# Patient Record
Sex: Male | Born: 1960 | Race: Black or African American | Hispanic: No | Marital: Married | Smoking: Never smoker
Health system: Southern US, Community
[De-identification: ages and names within clinical notes are randomized; demographics above are authoritative.]

## PROBLEM LIST (undated history)

## (undated) HISTORY — PX: CHOLECYSTECTOMY: SHX55

---

## 2006-03-08 ENCOUNTER — Emergency Department (HOSPITAL_COMMUNITY): Admission: EM | Admit: 2006-03-08 | Discharge: 2006-03-08 | Payer: Self-pay | Admitting: Emergency Medicine

## 2006-04-21 ENCOUNTER — Emergency Department (HOSPITAL_COMMUNITY): Admission: EM | Admit: 2006-04-21 | Discharge: 2006-04-21 | Payer: Self-pay | Admitting: Emergency Medicine

## 2008-10-13 ENCOUNTER — Emergency Department (HOSPITAL_COMMUNITY): Admission: EM | Admit: 2008-10-13 | Discharge: 2008-10-13 | Payer: Self-pay | Admitting: Pediatrics

## 2012-03-06 ENCOUNTER — Emergency Department (HOSPITAL_COMMUNITY)
Admission: EM | Admit: 2012-03-06 | Discharge: 2012-03-07 | Disposition: A | Discharge status: Home / Self Care | Payer: Worker's Compensation | Attending: Emergency Medicine | Admitting: Emergency Medicine

## 2012-03-06 ENCOUNTER — Encounter (HOSPITAL_COMMUNITY): Payer: Self-pay | Admitting: *Deleted

## 2012-03-06 DIAGNOSIS — Y9289 Other specified places as the place of occurrence of the external cause: Secondary | ICD-10-CM | POA: Insufficient documentation

## 2012-03-06 DIAGNOSIS — X12XXXA Contact with other hot fluids, initial encounter: Secondary | ICD-10-CM | POA: Insufficient documentation

## 2012-03-06 DIAGNOSIS — T24219A Burn of second degree of unspecified thigh, initial encounter: Secondary | ICD-10-CM | POA: Insufficient documentation

## 2012-03-06 DIAGNOSIS — T3111 Burns involving 10-19% of body surface with 10-19% third degree burns: Secondary | ICD-10-CM | POA: Insufficient documentation

## 2012-03-06 DIAGNOSIS — X131XXA Other contact with steam and other hot vapors, initial encounter: Secondary | ICD-10-CM | POA: Insufficient documentation

## 2012-03-06 DIAGNOSIS — T3110 Burns involving 10-19% of body surface with 0% to 9% third degree burns: Secondary | ICD-10-CM | POA: Insufficient documentation

## 2012-03-06 MED ORDER — SILVER SULFADIAZINE 1 % EX CREA
TOPICAL_CREAM | CUTANEOUS | Status: AC
  Filled 2012-03-06: qty 50

## 2012-03-06 MED ORDER — IBUPROFEN 800 MG PO TABS: 800.0000 mg | ORAL_TABLET | Freq: Three times a day (TID) | ORAL | Status: AC

## 2012-03-06 MED ORDER — SILVER SULFADIAZINE 1 % EX CREA: TOPICAL_CREAM | Freq: Every day | CUTANEOUS | Status: AC

## 2012-03-06 MED ORDER — KETOROLAC TROMETHAMINE 60 MG/2ML IM SOLN
60.0000 mg | Freq: Once | INTRAMUSCULAR | Status: AC
  Administered 2012-03-06: 60 mg via INTRAMUSCULAR
  Filled 2012-03-06: qty 2

## 2012-03-06 MED ORDER — OXYCODONE-ACETAMINOPHEN 5-325 MG PO TABS
1.0000 | ORAL_TABLET | Freq: Once | ORAL | Status: AC
  Administered 2012-03-06: 1 via ORAL
  Filled 2012-03-06: qty 1

## 2012-03-06 MED ORDER — OXYCODONE-ACETAMINOPHEN 5-325 MG PO TABS
1.0000 | ORAL_TABLET | Freq: Once | ORAL | Status: AC
  Filled 2012-03-06: qty 1

## 2012-03-06 MED ORDER — OXYCODONE-ACETAMINOPHEN 5-325 MG PO TABS: 1.0000 | ORAL_TABLET | Freq: Four times a day (QID) | ORAL | Status: AC | PRN
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
## 2012-03-06 NOTE — ED Notes (Signed)
Cool sterile towels applied to bilateral upper thighs.
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
## 2012-03-07 MED ORDER — SILVER SULFADIAZINE 1 % EX CREA
TOPICAL_CREAM | Freq: Once | CUTANEOUS | Status: AC
  Administered 2012-03-07: via TOPICAL

## 2012-03-17 ENCOUNTER — Ambulatory Visit (HOSPITAL_COMMUNITY)
Admission: RE | Admit: 2012-03-17 | Discharge: 2012-03-17 | Disposition: A | Discharge status: Home / Self Care | Payer: Worker's Compensation | Source: Ambulatory Visit | Attending: Preventative Medicine | Admitting: Preventative Medicine

## 2012-03-17 DIAGNOSIS — T24239A Burn of second degree of unspecified lower leg, initial encounter: Secondary | ICD-10-CM | POA: Insufficient documentation

## 2012-03-17 DIAGNOSIS — IMO0001 Reserved for inherently not codable concepts without codable children: Secondary | ICD-10-CM | POA: Insufficient documentation
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
## 2012-03-18 ENCOUNTER — Ambulatory Visit (HOSPITAL_COMMUNITY)
Admission: RE | Admit: 2012-03-18 | Discharge: 2012-03-18 | Disposition: A | Discharge status: Home / Self Care | Payer: Worker's Compensation | Source: Ambulatory Visit | Attending: Preventative Medicine | Admitting: Preventative Medicine

## 2012-03-18 NOTE — Progress Notes (Signed)
Physical Therapy - Wound Therapy  Treatment   Patient Details  Name: Daniel West MRN: [id number] Date of Birth: Aug 13, 1960  Today's Date: 03/18/2012 Time: 0930-1005 Time Calculation (min): 35 min Charges: Selective debridement (= or < 20 cm)   Visit#: 2  of 5   Re-eval: 03/20/12  Subjective Subjective Assessment Subjective: Pt states that he changed his dressings this morning.  Pain Assessment Pain Assessment Pain Score:   5 Pain Location: Leg Pain Orientation: Right;Left;Anterior;Proximal  Wound Therapy Pt received debridement on L thigh with saline soaked 4x4; R thgih with saline soaked 4x4 and forceps;  followed by dressing with xeroform, 4x4, kling, coban and netting.   Physical Therapy Assessment and Plan Wound Therapy - Assess/Plan/Recommendations Wound Therapy - Clinical Statement: Pt tolerates debridement well. L thigh was cleansed and R thigh was cleansed and debrided with forceps. Pt reports no change in pain at end of session. Wound Plan: Continue wound care per PT POC.       Problem List There is no problem list on file for this patient.    Seth Bake, PTA 03/18/2012, 10:12 AM

## 2012-03-19 ENCOUNTER — Ambulatory Visit (HOSPITAL_COMMUNITY)
Admission: RE | Admit: 2012-03-19 | Discharge: 2012-03-19 | Disposition: A | Discharge status: Home / Self Care | Payer: Worker's Compensation | Source: Ambulatory Visit | Attending: Preventative Medicine | Admitting: Preventative Medicine

## 2012-03-19 NOTE — Progress Notes (Signed)
Physical Therapy - Wound Therapy  Treatment   Patient Details  Name: Daniel West MRN: [id number] Date of Birth: 1961-06-04  Today's Date: 03/19/2012 Time: 4098-1191 Time Calculation (min): 30 min Charges:  Deb <20cm Visit#: 3  of 5   Re-eval: 03/20/12  Subjective Subjective Assessment Subjective: Pt. states his bandages keep sliding down.  Overall feeling better with less drainage.  Pain Assessment Pain Assessment Pain Assessment: No/denies pain  Wound Therapy R LE:  R thigh without drainage or opened wounds.  Cleansed area and instructed to apply lotion and keep protected from sun.  No further skilled care required for R LE. L LE:  Pt received debridement on L thigh with scapel and saline soaked 4x4's;  Remaining areas on approx. 50% granulated with 50% slough remaining.  Pt. Forgot silvadene cream; dressed with xeroform, 4x4, kling, coban and netting.     Physical Therapy Assessment and Plan Wound Therapy - Assess/Plan/Recommendations Wound Therapy - Clinical Statement: R LE wounds healed without drainage or pain.  Only treated L LE today; Pt. instructed to keep R LE moisturized and protected from sun. Wound Plan: Continue present woundcare treatment for L LE; R LE may be taken care of by patient.      Lurena Nida, PTA/CLT 03/19/2012, 11:52 AM

## 2012-03-20 ENCOUNTER — Ambulatory Visit (HOSPITAL_COMMUNITY)
Admission: RE | Admit: 2012-03-20 | Discharge: 2012-03-20 | Disposition: A | Discharge status: Home / Self Care | Payer: Worker's Compensation | Source: Ambulatory Visit | Attending: Preventative Medicine | Admitting: Preventative Medicine
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
## 2012-03-23 ENCOUNTER — Ambulatory Visit (HOSPITAL_COMMUNITY)
Admission: RE | Admit: 2012-03-23 | Discharge: 2012-03-23 | Disposition: A | Discharge status: Home / Self Care | Payer: Worker's Compensation | Source: Ambulatory Visit | Attending: Preventative Medicine | Admitting: Preventative Medicine

## 2012-03-23 NOTE — Progress Notes (Signed)
Physical Therapy - Wound Therapy  Treatment   Patient Details  Name: AASHISH HAMM MRN: [id number] Date of Birth: Jun 01, 1961  Today's Date: 03/23/2012 Time: 4098-1191 Time Calculation (min): 29 min  Visit#: 5  of 5   Re-eval: 03/24/12  Subjective Subjective Assessment Subjective: Pt. was 98' late for appt. today;  States he's having more irritation/shooting pains down his L LE when standing with leg extended.  Otherwise, doing OK and R LE is doing well without bandages.  Reports he is applying cocoa butter to R LE.  Pain Assessment Pain Assessment Pain Assessment: No/denies pain  Wound Therapy R LE: No intervention needed; Pt. Is keeping moisturized with cocoa butter independently.   L LE: Pt received debridement on L thigh with forceps and saline soaked 4x4's; Remaining areas on approx. 60% granulated with 40% slough remaining. Wounds dressed with silvadene cream, 4x4, kling, and netting.       Physical Therapy Assessment and Plan Wound Therapy - Assess/Plan/Recommendations Wound Therapy - Clinical Statement: L LE wounds continue to granulate; Only applied silvadene to 2 areas that still have slough remaining.  Moisturized remaining areas with vaseline. Wound Plan: Continue current woundcare; ;Returns to Dr. Wende Crease Wednesday. Re-evaluate next visit.         Lurena Nida, PTA/CLT 03/23/2012, 9:56 AM

## 2012-03-24 ENCOUNTER — Ambulatory Visit (HOSPITAL_COMMUNITY)
Admission: RE | Admit: 2012-03-24 | Discharge: 2012-03-24 | Disposition: A | Discharge status: Home / Self Care | Payer: Worker's Compensation | Source: Ambulatory Visit | Attending: Preventative Medicine | Admitting: Preventative Medicine

## 2012-03-24 NOTE — Progress Notes (Signed)
Physical Therapy - Wound Therapy  Treatment   Patient Details  Name: Daniel West MRN: [id number] Date of Birth: [birth year]  Today's Date: 03/24/2012 Time: 2956-2130 Time Calculation (min): 35 min Charges: Selective debridement (= or < 20 cm) Visit#: 6   Re-eval: 03/25/12  Subjective Subjective Assessment Subjective: Pt states that he only has pain in his leg when standing or walking. Resting he has no pain.  Pain Assessment Pain Assessment Pain Assessment: No/denies pain (at rest) Pain Score: 0-No pain  Wound Therapy R LE: No intervention needed; Pt. Is keeping moisturized with cocoa butter independently.  L LE: Pt received debridement on L thigh with forceps and saline soaked 4x4's; Remaining areas on approx. 60% granulated with 40% slough remaining. Wounds dressed with silvadene cream, 4x4, kling, and netting. Vaseline applied to healed area.   Physical Therapy Assessment and Plan Wound Therapy - Assess/Plan/Recommendations Wound Therapy - Clinical Statement: LLE wounds continue to progress well. Silvadene applied to two areas that have slough. Hydrogel applied to two very small open areas that are 100% granulated. Significant amount of dry skin removed with forceps. Vaseline applied to healed areas to protest skin integrity.  Wound Plan: Take measurements next session as pt returns to MD 03/25/12.       Problem List There is no problem list on file for this patient.   Seth Bake, PTA 03/24/2012, 9:58 AM

## 2012-03-25 ENCOUNTER — Ambulatory Visit (HOSPITAL_COMMUNITY)
Admission: RE | Admit: 2012-03-25 | Discharge: 2012-03-25 | Disposition: A | Discharge status: Home / Self Care | Payer: Worker's Compensation | Source: Ambulatory Visit | Attending: Preventative Medicine | Admitting: Preventative Medicine

## 2012-03-25 NOTE — Progress Notes (Signed)
Physical Therapy - Wound Therapy Re-evaluation  Treatment   Patient Details  Name: MOIZ RYANT MRN: [id number] Date of Birth: 07/02/1961  Today's Date: 03/25/2012 Time: 4098-1191 Time Calculation (min): 30 min Charges:  Deb <20cm Visit#: 7  of 10   Re-eval: 04/03/12  Subjective Subjective Assessment Subjective: Pt. states he's having more itching than pain but does have some when standing or walking  Pain Assessment Pain Assessment Pain Assessment: No/denies pain  Wound Therapy R LE: R thigh without drainage or opened wounds. Cleansed area and instructed to apply lotion and keep protected from sun. No further skilled care required for R LE. L LE: Pt received debridement on L thigh with forceps and saline soaked 4x4's to remove dry skin, no slough remaining.  Currently 100% granulated.  Two areas remaining; One on medial thigh with 2 wounds that are approx. 3cm2 and two areas on lateral thigh that approx. 1cm2 in size.  Wounds Dressed with xeroform, 4x4, kling, coban and netting.       Physical Therapy Assessment and Plan Wound Therapy - Assess/Plan/Recommendations Wound Therapy - Clinical Statement: R thigh wounds healed; L thigh wounds now 100% granulated without need for silvadene cream. Two areas remain on L thigh, medially with 2 smaller wounds approx. 1cm2 in size and medially approx 3cm2 (see scanned wound assessment drawing completed today). Pt. instructed to dress with xeroform. Returns to MD today; has met all goals and is ready for self care unless MD wants to continue skilled intervention until complete closure on L thigh.  Wound Plan: Await MD orders; assume discharge to [facility] if does not return.      Goals Wound Therapy Goals - Improve the function of patient's integumentary system by progressing the wound(s) through the phases of wound healing by: Decrease Necrotic Tissue to 0% - Progress: Met Increase Granulation Tissue to 100% - Progress: Met Decrease  Length/Width/Depth(cm):  R LE to be healed and no longer needing bandaging; L LE to be decreased in size by 50% - Progress: Met (No specific measurements taken at initial eval but less than 50% remains open in affected area) Improve Drainage Characteristics to minimal drainage - Progress: Met Patient/Family Instruction Goal to verbalize proper care including showering, washing, lotion and dressing - Progress: Met   Lurena Nida, PTA/CLT 03/25/2012, 9:50 AM

## 2012-03-26 ENCOUNTER — Ambulatory Visit (HOSPITAL_COMMUNITY)
Admission: RE | Admit: 2012-03-26 | Discharge: 2012-03-26 | Disposition: A | Discharge status: Home / Self Care | Payer: Worker's Compensation | Source: Ambulatory Visit | Attending: Preventative Medicine | Admitting: Preventative Medicine

## 2012-03-26 NOTE — Progress Notes (Signed)
Physical Therapy - Wound Therapy  Treatment   Patient Details  Name: Daniel West MRN: [id number] Date of Birth: 10/20/1960  Today's Date: 03/26/2012 Time: 4098-1191 Time Calculation (min): 20 min Charges:  Deb <20cm Visit#: 8  of 11   Re-eval: 03/31/12  Subjective Subjective Assessment Subjective: Pt reports MD wants him to continue woundcare daily until next Tuesday (4 more days)  Pain Assessment Pain Assessment Pain Assessment: No/denies pain  Wound Therapy L thigh wounds debrided with forceps/gauze.  100% granulation following session.  Dry skin debrided from perimeter and moisturized with vaseline.  Xerform applied to wounds and dressed with gauze, kerlix, coban and #7 netting.    Physical Therapy Assessment and Plan Wound Therapy - Assess/Plan/Recommendations Wound Therapy - Clinical Statement: R thigh wounds healed; L thigh wounds now 100% granulated without need for silvadene cream. Two areas remain on L thigh but are healing rapidly.  Wound Plan: Continue daily woundcare until returns to MD next Tuesday (4 visits)      Lurena Nida, PTA/CLT 03/26/2012, 3:00 PM

## 2012-03-27 ENCOUNTER — Ambulatory Visit (HOSPITAL_COMMUNITY)
Admission: RE | Admit: 2012-03-27 | Discharge: 2012-03-27 | Disposition: A | Discharge status: Home / Self Care | Payer: Worker's Compensation | Source: Ambulatory Visit | Attending: Preventative Medicine | Admitting: Preventative Medicine

## 2012-03-27 NOTE — Progress Notes (Signed)
Physical Therapy - Wound Therapy  Treatment   Patient Details  Name: Daniel West MRN: [id number] Date of Birth: 12-03-1960  Today's Date: 03/27/2012 Time: 7846-9629 Time Calculation (min): 19 min  Visit#: 9  of 11   Re-eval:  9/17  Subjective Subjective Assessment Subjective: Pt states he is having no pain.  Pt is impressed on how quickly he is healing.  Pain Assessment Pain Assessment Pain Assessment: No/denies pain  Wound Therapy  Pt received minimal debridement as pt burn is 95% granulated.  Pt now only has small open area along medial aspect of LE.  Pt had xeroform applied to open area after debridement.  Encouraged to both moisturize and use sun block to  newly formed skin.  4x4/Kerlix and netting applies to LE.     Physical Therapy Assessment and Plan Wound Therapy - Assess/Plan/Recommendations Wound Therapy - Clinical Statement: Wounds on lateral aspect are now healed. Hydrotherapy Plan: Debridement;Dressing change;Patient/family education (Pt states he is going fishing this weekend.  Stressed the importance of sunblock. Wound Plan: Pt burn will most likely be healed by Monday and ready for D/C      Goals  100% healing  Problem List There is no problem list on file for this patient.   Rubert Frediani,CINDY 03/27/2012, 12:22 PM

## 2012-03-30 ENCOUNTER — Ambulatory Visit (HOSPITAL_COMMUNITY)
Admission: RE | Admit: 2012-03-30 | Discharge: 2012-03-30 | Disposition: A | Discharge status: Home / Self Care | Payer: Worker's Compensation | Source: Ambulatory Visit | Attending: Preventative Medicine | Admitting: Preventative Medicine

## 2012-03-30 NOTE — Progress Notes (Addendum)
Physical Therapy - Wound Therapy  Treatment   Patient Details  Name: Daniel West MRN: [id number] Date of Birth: [birth year]  Today's Date: 03/30/2012 Time:  13:50- 14:07    Visit#: 10  of 10    Subjective Subjective Assessment Subjective: I almost didn't wrap it.  Pain Assessment Pain Assessment Pain Assessment: No/denies pain  Wound Therapy  Pt scabs gently cleansed with 4x4/Carraklenz.  Area's underneath are healed except for a small area 1 cm diameter on the medial aspect of pt L LE.  Pt verbalizes understanding in the care for his legs.     Physical Therapy Assessment and Plan Wound Therapy - Assess/Plan/Recommendations Wound Therapy - Clinical Statement: Pt areas gently debrided with healed skin under all scabs except one that is on the   medial aspect of L LE that is 1 cm diameter.  All other areas are healed.  Pt is no longer in need of skilled therapy. Wound Plan: D/C from skilled care at this time.  Pt is I in self-care of wound at this time.      Goals Wound Therapy Goals - Improve the function of patient's integumentary system by progressing the wound(s) through the phases of wound healing by: Decrease Necrotic Tissue - Progress: Met Increase Granulation Tissue - Progress: Met Decrease Length/Width/Depth - Progress: Met Improve Drainage Characteristics - Progress: Met Patient/Family Instruction Goal - Progress: Met  Problem List There is no problem list on file for this patient.  Charge:  Self care x 1 GP    RUSSELL,CINDY 03/30/2012, 2:11 PM

## 2012-03-31 ENCOUNTER — Ambulatory Visit (HOSPITAL_COMMUNITY): Payer: Self-pay | Admitting: Physical Therapy

## 2016-11-18 ENCOUNTER — Encounter (HOSPITAL_COMMUNITY): Payer: Self-pay | Admitting: Cardiology

## 2016-11-18 ENCOUNTER — Emergency Department (HOSPITAL_COMMUNITY)
Admission: EM | Admit: 2016-11-18 | Discharge: 2016-11-18 | Disposition: A | Discharge status: Home / Self Care | Payer: No Typology Code available for payment source | Attending: Emergency Medicine | Admitting: Emergency Medicine

## 2016-11-18 DIAGNOSIS — R202 Paresthesia of skin: Secondary | ICD-10-CM | POA: Diagnosis not present

## 2016-11-18 DIAGNOSIS — M79672 Pain in left foot: Secondary | ICD-10-CM | POA: Diagnosis present

## 2016-11-18 NOTE — Discharge Instructions (Signed)
Elevate your foot when possible.  Try taking ibuprofen 600 mg 3 times a day with food.  Follow-up with the foot doctor listed if the symptoms are not improving in a week

## 2016-11-18 NOTE — ED Provider Notes (Signed)
[facility]-EMERG[facility]Y DEPT Provider Note    By signing my name below, I, Earmon Phoenix, attest that this documentation has been prepared under the direction and in the presence of Shaynah Hund, PA-C. Electronically Signed: Earmon Phoenix, ED Scribe. 11/18/16. 2:27 PM.    History   Chief Complaint Chief Complaint  Patient presents with  . Leg Pain   The history is provided by the patient and medical records. No language interpreter was used.    Daniel West is an obese 56 y.o. male who presents to the Emergency Department complaining of left foot pain that began last week. He reports associated left ankle pain. He reports some tingling of the area. He states he may have hit the area on something while working but is unsure. He states he wears steele toed rubber boots for 12 hours per day with only socks underneath. He states the boots are ill-fitting. He has not taken anything for pain but has been soaking it in warm salt water. There are no modifying factors noted. He denies numbness, swelling or redness, or weakness of the left foot, ankle or leg, bruising, wounds, calf pain, back, hip, knee or thigh pain. He denies h/o DM.   History reviewed. No pertinent past medical history.  There are no active problems to display for this patient.   Past Surgical History:  Procedure Laterality Date  . CHOLECYSTECTOMY         Home Medications    Prior to Admission medications   Medication Sig Start Date End Date Taking? Authorizing Provider  UNABLE TO FIND Take 1 tablet by mouth 2 (two) times daily. Med Name: [employer] ([employer] [facility])    [provider]    Family History History reviewed. No pertinent family history.  Social History Social History  Substance Use Topics  . Smoking status: Never Smoker  . Smokeless tobacco: Not on file  . Alcohol use No     Allergies   Patient has no known allergies.   Review of Systems Review of Systems    Constitutional: Negative for chills, fatigue and fever.  HENT: Negative for sore throat and trouble swallowing.   Respiratory: Negative for shortness of breath.   Cardiovascular: Negative for chest pain and palpitations.  Gastrointestinal: Negative for abdominal pain, blood in stool, nausea and vomiting.  Genitourinary: Negative for dysuria, flank pain and hematuria.  Musculoskeletal: Positive for arthralgias. Negative for back pain, myalgias, neck pain and neck stiffness.  Skin: Negative for color change, rash and wound.  Neurological: Negative for dizziness, weakness and numbness (does have tingling to left foot).  Hematological: Does not bruise/bleed easily.     Physical Exam Updated Vital Signs BP 137/83 (BP Location: Right Arm)   Pulse 71   Temp 98.2 F (36.8 C) (Oral)   Resp 18   Ht 6' (1.829 m)   Wt 243 lb (110.2 kg)   SpO2 99%   BMI 32.96 kg/m   Physical Exam  Constitutional: He is oriented to person, place, and time. He appears well-developed and well-nourished. No distress.  HENT:  Head: Normocephalic.  Neck: No Kernig's sign noted. No thyromegaly present.  Cardiovascular: Normal rate, regular rhythm and intact distal pulses.   Dorsalis pedis pulses intact of left foot. Good cap refill.  Pulmonary/Chest: Effort normal and breath sounds normal.  Abdominal: Normal appearance.  Musculoskeletal: Normal range of motion. He exhibits no edema, tenderness or deformity.  Nontender to palpation to medial left foot. No erythema or edema. Full  ROM of the joint.  Neurological: He is alert and oriented to person, place, and time. He displays normal reflexes. No sensory deficit.  Skin: Skin is warm and dry. Capillary refill takes less than 2 seconds. No rash noted.  No overlying skin changes.  Nursing note and vitals reviewed.    ED Treatments / Results  DIAGNOSTIC STUDIES: Oxygen Saturation is 99% on RA, normal by my interpretation.   COORDINATION OF CARE: 2:19 PM- Will  provide note for work. Recommended OTC Ibuprofen but pt states he does not take pills. Pt verbalizes understanding and agrees to plan.  Medications - No data to display  Labs (all labs ordered are listed, but only abnormal results are displayed) Labs Reviewed - No data to display  EKG  EKG Interpretation None       Radiology No results found.  Procedures Procedures (including critical care time)  Medications Ordered in ED Medications - No data to display   Initial Impression / Assessment and Plan / ED Course  I have reviewed the triage vital signs and the nursing notes.  Pertinent labs & imaging results that were available during my care of the patient were reviewed by me and considered in my medical decision making (see chart for details).     Patient presents for left ankle and foot pain from an unknown cause. Will refer to podiatry. appears to be a superficial paresthesia.  Encouraged pt to take OTC Ibuprofen for at least three days to see if pain and inflammation is relieved. Conservative therapy recommended and discussed. Patient will be discharged home & is agreeable with above plan. Returns precautions discussed. Pt appears safe for discharge.   Final Clinical Impressions(s) / ED Diagnoses   Final diagnoses:  Paresthesia of left foot    New Prescriptions New Prescriptions   No medications on file    I personally performed the services described in this documentation, which was scribed in my presence. The recorded information has been reviewed and is accurate.    Pauline Ausriplett, Adriena Manfre, PA-C 11/20/16 1235    Eber HongMiller, Brian, MD 11/24/16 2023

## 2016-11-18 NOTE — ED Triage Notes (Signed)
Lower left leg numbness and pain since last week.  Unknown if injured

## 2019-09-12 ENCOUNTER — Encounter (HOSPITAL_COMMUNITY): Payer: Self-pay | Admitting: *Deleted

## 2019-09-12 ENCOUNTER — Emergency Department (HOSPITAL_COMMUNITY)
Admission: EM | Admit: 2019-09-12 | Discharge: 2019-09-12 | Disposition: A | Discharge status: Home / Self Care | Payer: 59 | Attending: Emergency Medicine | Admitting: Emergency Medicine

## 2019-09-12 ENCOUNTER — Other Ambulatory Visit: Payer: Self-pay

## 2019-09-12 ENCOUNTER — Emergency Department (HOSPITAL_COMMUNITY): Payer: 59

## 2019-09-12 DIAGNOSIS — M25331 Other instability, right wrist: Secondary | ICD-10-CM

## 2019-09-12 DIAGNOSIS — M25531 Pain in right wrist: Secondary | ICD-10-CM | POA: Diagnosis present

## 2019-09-12 MED ORDER — NAPROXEN 250 MG PO TABS
500.0000 mg | ORAL_TABLET | Freq: Once | ORAL | Status: AC
  Administered 2019-09-12: 500 mg via ORAL
  Filled 2019-09-12: qty 2

## 2019-09-12 MED ORDER — NAPROXEN 500 MG PO TABS: 500.0000 mg | ORAL_TABLET | Freq: Two times a day (BID) | ORAL | 0 refills | Status: DC

## 2019-09-12 NOTE — ED Provider Notes (Signed)
[facility] EMERGENCY DEPARTMENT Provider Note   CSN: [id number] Arrival date & time: 09/12/19  1433     History Chief Complaint  Patient presents with  . Wrist Pain    Daniel West is a 59 y.o. male.  HPI   59 year old male, reports that he had a slip and fall while he was on a ladder approximately a month ago, caught himself with his right hand to prevent falling and had to hold on for several seconds before he could readjust and get his footing back.  Since that time he has had some intermittent pain in the right wrist, seems to sometimes radiate up all the way to the shoulder but does not have the pain continuously and has no numbness or weakness associated with the pain is generally in the wrist on the dorsal aspect and towards the ulnar aspect.  He does not have the pain at this time but states it comes and goes, seems to happen occasionally when he uses his hand actively.  History reviewed. No pertinent past medical history.  There are no problems to display for this patient.   Past Surgical History:  Procedure Laterality Date  . CHOLECYSTECTOMY         No family history on file.  Social History   Tobacco Use  . Smoking status: Never Smoker  . Smokeless tobacco: Never Used  Substance Use Topics  . Alcohol use: No  . Drug use: No    Home Medications Prior to Admission medications   Medication Sig Start Date End Date Taking? Authorizing Provider  naproxen (NAPROSYN) 500 MG tablet Take 1 tablet (500 mg total) by mouth 2 (two) times daily with a meal. 09/12/19   Noemi Chapel, MD    Allergies    Patient has no known allergies.  Review of Systems   Review of Systems  All other systems reviewed and are negative.   Physical Exam Updated Vital Signs BP (!) 149/89 (BP Location: Right Arm)   Pulse 73   Temp 98.5 F (36.9 C) (Oral)   Resp 16   Wt 110.2 kg   SpO2 100%   BMI 32.96 kg/m   Physical Exam Vitals and nursing note reviewed.    Constitutional:      General: He is not in acute distress.    Appearance: He is well-developed.  HENT:     Head: Normocephalic and atraumatic.  Eyes:     General: No scleral icterus.       Right eye: No discharge.        Left eye: No discharge.     Conjunctiva/sclera: Conjunctivae normal.  Neck:     Thyroid: No thyromegaly.     Vascular: No JVD.  Cardiovascular:     Rate and Rhythm: Normal rate.     Pulses: Normal pulses.  Pulmonary:     Effort: Pulmonary effort is normal. No respiratory distress.  Musculoskeletal:        General: No tenderness, deformity or signs of injury. Normal range of motion.     Right lower leg: No edema.     Left lower leg: No edema.     Comments: Totally normal range of motion of the wrist to both dorsiflexion and palmar flexion, ulnar and radial deviation, normal ability to flex and extend all the fingers of the right hand, no obvious deformity, compartments are soft of the right upper extremity with normal range of motion of the shoulder and the elbow as well  Skin:  General: Skin is warm and dry.     Findings: No erythema or rash.  Neurological:     Mental Status: He is alert.     Coordination: Coordination normal.     Comments: Normal strength and sensation of the entire right upper extremity  Psychiatric:        Behavior: Behavior normal.     ED Results / Procedures / Treatments   Labs (all labs ordered are listed, but only abnormal results are displayed) Labs Reviewed - No data to display  EKG None  Radiology DG Wrist Complete Right  Result Date: 09/12/2019 CLINICAL DATA:  Trauma 1 month ago, wrist pain EXAM: RIGHT WRIST - COMPLETE 3+ VIEW COMPARISON:  None. FINDINGS: There is approximately 7 mm widening of the scapholunate interval. Mild radioscaphoid arthrosis. No fracture. Soft tissues are unremarkable. IMPRESSION: There is approximately 7 mm widening of the scapholunate interval, consistent with scapholunate ligament disruption.  This is of uncertain acuity given radioscaphoid arthrosis. 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 Electronically Signed   By: Lauralyn Primes M.D.   On: 09/12/2019 16:19    Procedures Procedures (including critical care time)  Medications Ordered in ED Medications  naproxen (NAPROSYN) tablet 500 mg (500 mg Oral Given 09/12/19 1523)    ED Course  I have reviewed the triage vital signs and the nursing notes.  Pertinent labs & imaging results that were available during my care of the patient were reviewed by me and considered in my medical decision making (see chart for details).  Clinical Course as of Sep 11 1713  Wynelle Link Sep 12, 2019  1713 The x-ray does not fact show some scapholunate interval dilation, possible scapholunate ligamentous disruption, discussed with Dr. Romeo Apple who will see the in the office and recommends against immobilization at this time as this is not after 1 month going to be reparative in any way, patient was informed, will be prescribed an anti-inflammatory   [BM]    Clinical Course User Index [BM] Eber Hong, MD   MDM Rules/Calculators/A&P                      This patient has what appears to be a sprain of his wrist, he only has intermittent pain, he appears well, will obtain an x-ray to rule out any occult or underlying fractures however he has no snuffbox tenderness and at this time thankfully has no reproducible tenderness on exam.  The patient does endorse that he came with his wife who came for chest pain and he wanted to get checked out while he was here  Final Clinical Impression(s) / ED Diagnoses Final diagnoses:  Scapholunate instability of right wrist    Rx / DC Orders ED Discharge Orders         Ordered    naproxen (NAPROSYN) 500 MG tablet  2 times daily with meals     09/12/19 1714           Eber Hong, MD 09/12/19 1715

## 2019-09-12 NOTE — Discharge Instructions (Signed)
Your x-ray shows that there is likely a ligament injury between 2 of the bones of your wrist, you will need to follow-up with an orthopedic surgeon, Dr. Romeo Apple is on-call and I spoke with him on the phone, he is willing to see you in the office, please see his phone number above and call the office for the next available appointment.  He may take naproxen twice a day for pain as needed.

## 2019-09-12 NOTE — ED Triage Notes (Signed)
Pt is here for right wrist pain which began about a month and a half ago when pt had to catch himself when getting off a ladder.  Pain has gotten a bit better but certain movements still hurt.  Pt also notes right shoulder pain with certain movements.

## 2019-09-14 ENCOUNTER — Encounter: Payer: Self-pay | Admitting: Orthopedic Surgery

## 2019-09-14 ENCOUNTER — Other Ambulatory Visit: Payer: Self-pay

## 2019-09-14 ENCOUNTER — Ambulatory Visit (INDEPENDENT_AMBULATORY_CARE_PROVIDER_SITE_OTHER): Payer: 59 | Admitting: Orthopedic Surgery

## 2019-09-14 VITALS — BP 142/87 | HR 86 | Temp 98.1°F | Ht 71.0 in | Wt 255.0 lb

## 2019-09-14 DIAGNOSIS — M19131 Post-traumatic osteoarthritis, right wrist: Secondary | ICD-10-CM

## 2019-09-14 MED ORDER — MELOXICAM 7.5 MG PO TABS: 7.5000 mg | ORAL_TABLET | Freq: Every day | ORAL | 5 refills | Status: DC

## 2019-09-14 NOTE — Patient Instructions (Signed)
Arthritis of the wrist   (SLAC WRIST)

## 2019-09-14 NOTE — Progress Notes (Signed)
Daniel West  09/14/2019  Body mass index is 35.57 kg/m.   HISTORY SECTION :  Chief Complaint  Patient presents with  . Wrist Injury   HPI The patient presents for evaluation of  (mild/moderate/severe/ ) throbbing mild pain, in the (right /left) right wrist, for a period of 6 months, associated with pain with certain activities.  Prior treatment none   Review of Systems  All other systems reviewed and are negative.    has no past medical history on file.   Past Surgical History:  Procedure Laterality Date  . CHOLECYSTECTOMY      Body mass index is 35.57 kg/m.   No Known Allergies   Current Outpatient Medications:  .  naproxen (NAPROSYN) 500 MG tablet, Take 1 tablet (500 mg total) by mouth 2 (two) times daily with a meal., Disp: 30 tablet, Rfl: 0 .  meloxicam (MOBIC) 7.5 MG tablet, Take 1 tablet (7.5 mg total) by mouth daily., Disp: 30 tablet, Rfl: 5   PHYSICAL EXAM SECTION: 1) BP (!) 142/87   Pulse 86   Temp 98.1 F (36.7 C)   Ht 5\' 11"  (1.803 m)   Wt 255 lb (115.7 kg)   BMI 35.57 kg/m   Body mass index is 35.57 kg/m. General appearance: Well-developed well-nourished no gross deformities  2) Cardiovascular normal pulse and perfusion , normal color   3) Neurologically deep tendon reflexes are equal and normal, no sensation loss or deficits no pathologic reflexes  4) Psychological: Awake alert and oriented x3 mood and affect normal  5) Skin no lacerations or ulcerations no nodularity no palpable masses, no erythema or nodularity  6) Musculoskeletal:  Tenderness over the scapholunate and radial scaphoid joint but he has preserved motion good grip strength and no instability I did not detect a click   MEDICAL DECISION MAKING  A.  Encounter Diagnosis  Name Primary?  . 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 DATA ANALYSED:  IMAGING: Independent interpretation of images: Images were obtained on September 12, 2019 at [facility] 4 views of the wrist including a scaphoid view which shows widening the scapholunate interval with joint space narrowing in the radial scaphoid joint with minimal dorsiflexion of the lunate.  It is indeterminate whether this is an acute or chronic injury although favor chronic injury based on the amount of arthritis seen between the radius and the scaphoid    Outside records reviewed: ER records do not indicate any type of reduction maneuver just recommended follow-up with orthopedics  C. MANAGEMENT discussed treatment options including medication for pain discussion of the natural history of the disease and progression towards fusion or proximal row carpectomy  Patient would like to go with medication at this time and call [facility] when things get worse or function deteriorates.   Meds ordered this encounter  Medications  . meloxicam (MOBIC) 7.5 MG tablet    Sig: Take 1 tablet (7.5 mg total) by mouth daily.    Dispense:  30 tablet    Refill:  5      [place], MD  09/14/2019 3:29 PM

## 2020-03-08 ENCOUNTER — Emergency Department (HOSPITAL_COMMUNITY)
Admission: EM | Admit: 2020-03-08 | Discharge: 2020-03-08 | Disposition: A | Discharge status: Home / Self Care | Payer: 59 | Attending: Emergency Medicine | Admitting: Emergency Medicine

## 2020-03-08 ENCOUNTER — Encounter (HOSPITAL_COMMUNITY): Payer: Self-pay | Admitting: *Deleted

## 2020-03-08 ENCOUNTER — Other Ambulatory Visit: Payer: Self-pay

## 2020-03-08 ENCOUNTER — Emergency Department (HOSPITAL_COMMUNITY): Payer: 59

## 2020-03-08 DIAGNOSIS — M791 Myalgia, unspecified site: Secondary | ICD-10-CM | POA: Insufficient documentation

## 2020-03-08 DIAGNOSIS — U071 COVID-19: Secondary | ICD-10-CM | POA: Insufficient documentation

## 2020-03-08 DIAGNOSIS — Z79899 Other long term (current) drug therapy: Secondary | ICD-10-CM | POA: Diagnosis not present

## 2020-03-08 DIAGNOSIS — R519 Headache, unspecified: Secondary | ICD-10-CM | POA: Diagnosis present

## 2020-03-08 LAB — CBC WITH DIFFERENTIAL/PLATELET
Abs Immature Granulocytes: 0.01 10*3/uL (ref 0.00–0.07)
Basophils Absolute: 0 10*3/uL (ref 0.0–0.1)
Basophils Relative: 1 %
Eosinophils Absolute: 0 10*3/uL (ref 0.0–0.5)
Eosinophils Relative: 0 %
HCT: 46.9 % (ref 39.0–52.0)
Hemoglobin: 14.9 g/dL (ref 13.0–17.0)
Immature Granulocytes: 0 %
Lymphocytes Relative: 26 %
Lymphs Abs: 0.8 10*3/uL (ref 0.7–4.0)
MCH: 26.9 pg (ref 26.0–34.0)
MCHC: 31.8 g/dL (ref 30.0–36.0)
MCV: 84.7 fL (ref 80.0–100.0)
Monocytes Absolute: 0.4 10*3/uL (ref 0.1–1.0)
Monocytes Relative: 12 %
Neutro Abs: 2 10*3/uL (ref 1.7–7.7)
Neutrophils Relative %: 61 %
Platelets: 200 10*3/uL (ref 150–400)
RBC: 5.54 MIL/uL (ref 4.22–5.81)
RDW: 14.8 % (ref 11.5–15.5)
WBC: 3.2 10*3/uL — ABNORMAL LOW (ref 4.0–10.5)
nRBC: 0 % (ref 0.0–0.2)

## 2020-03-08 LAB — BASIC METABOLIC PANEL
Anion gap: 12 (ref 5–15)
BUN: 12 mg/dL (ref 6–20)
CO2: 25 mmol/L (ref 22–32)
Calcium: 8.6 mg/dL — ABNORMAL LOW (ref 8.9–10.3)
Chloride: 98 mmol/L (ref 98–111)
Creatinine, Ser: 1.07 mg/dL (ref 0.61–1.24)
GFR calc Af Amer: >60 mL/min (ref >60)
GFR calc non Af Amer: >60 mL/min (ref >60)
Glucose, Bld: 100 mg/dL — ABNORMAL HIGH (ref 70–99)
Potassium: 3.6 mmol/L (ref 3.5–5.1)
Sodium: 135 mmol/L (ref 135–145)

## 2020-03-08 LAB — SARS CORONAVIRUS 2 BY RT PCR (HOSPITAL ORDER, PERFORMED IN ~~LOC~~ HOSPITAL LAB): SARS Coronavirus 2: POSITIVE — AB

## 2020-03-08 MED ORDER — ACETAMINOPHEN 325 MG PO TABS
650.0000 mg | ORAL_TABLET | Freq: Once | ORAL | Status: AC
  Administered 2020-03-08: 650 mg via ORAL
  Filled 2020-03-08: qty 2

## 2020-03-08 NOTE — Discharge Instructions (Signed)
You have been evaluated for general malaise, fever and chills.  You have been diagnosed with COVID-19 I need you to self quarantine for the next 10 days.  Please stay hydrated drink plenty of water, Gatorade, Pedialyte, stay away from sodas, teas and other sugary drinks.  Please remember to eat as this will help with your Covid infection.  You may take over-the-counter pain medications like ibuprofen or Tylenol as needed every 6 hours please follow dosing in the back of bottle.  I have given the contact information for post Covid care please call them at your earliest convenience as they will help give you further instruction to help manage your Covid symptoms.  I want him back to emergency department if you develop shortness of breath, chest pain, uncontrolled nausea, vomiting, diarrhea as these symptoms require further evaluation management.

## 2020-03-08 NOTE — ED Provider Notes (Signed)
[facility] EMERGENCY DEPARTMENT Provider Note   CSN: [id number] Arrival date & time: 03/08/20  1315     History Chief Complaint  Patient presents with  . Headache    Daniel West is a 59 y.o. male.  HPI   Patient with no significant medical history presents to the emergency department with chief complaint of fever, chills, cough, diarrhea since Monday.  Patient states his wife was recently diagnosed with Covid and since then he has had felt worse over the last few days.  He states that he has lost his taste and smell and has had little appetite.  He has been drinking plenty fluids and denies sore throat,congestion, nausea, vomiting, hematochezia, dysuria, pedal edema.  He has no alleviating or aggravating factors.  He is not vaccinated for COVID-19. History reviewed. No pertinent past medical history.  There are no problems to display for this patient.   Past Surgical History:  Procedure Laterality Date  . CHOLECYSTECTOMY         Family History  Problem Relation Age of Onset  . Diabetes Mother     Social History   Tobacco Use  . Smoking status: Never Smoker  . Smokeless tobacco: Never Used  Substance Use Topics  . Alcohol use: No  . Drug use: No    Home Medications Prior to Admission medications   Medication Sig Start Date End Date Taking? Authorizing Provider  meloxicam (MOBIC) 7.5 MG tablet Take 1 tablet (7.5 mg total) by mouth daily. 09/14/19   Vickki Hearing, MD  naproxen (NAPROSYN) 500 MG tablet Take 1 tablet (500 mg total) by mouth 2 (two) times daily with a meal. 09/12/19   Eber Hong, MD    Allergies    Patient has no known allergies.  Review of Systems   Review of Systems  Constitutional: Positive for appetite change, chills and fever.  HENT: Negative for congestion, sore throat and tinnitus.   Eyes: Negative for visual disturbance.  Respiratory: Positive for cough. Negative for shortness of breath.   Cardiovascular: Negative for chest  pain.  Gastrointestinal: Positive for diarrhea. Negative for abdominal pain, nausea and vomiting.  Genitourinary: Negative for enuresis, flank pain and frequency.  Musculoskeletal: Positive for myalgias. Negative for back pain.  Skin: Negative for rash.  Neurological: Negative for dizziness and headaches.  Hematological: Does not bruise/bleed easily.    Physical Exam Updated Vital Signs BP (!) 142/76 (BP Location: Right Arm)   Pulse 79   Temp 99.4 F (37.4 C) (Oral)   Resp 16   Ht 6' (1.829 m)   Wt 115.7 kg   SpO2 100%   BMI 34.58 kg/m   Physical Exam Vitals and nursing note reviewed.  Constitutional:      General: He is not in acute distress.    Appearance: He is not ill-appearing.  HENT:     Head: Normocephalic and atraumatic.     Nose: No congestion.     Mouth/Throat:     Mouth: Mucous membranes are moist.     Pharynx: Oropharynx is clear. No oropharyngeal exudate or posterior oropharyngeal erythema.  Eyes:     General: No scleral icterus. Cardiovascular:     Rate and Rhythm: Normal rate and regular rhythm.     Pulses: Normal pulses.     Heart sounds: No murmur heard.  No friction rub. No gallop.   Pulmonary:     Effort: No respiratory distress.     Breath sounds: No wheezing, rhonchi or rales.  Abdominal:     General: There is no distension.     Tenderness: There is no abdominal tenderness. There is no right CVA tenderness, left CVA tenderness or guarding.  Musculoskeletal:        General: No swelling.     Right lower leg: No edema.     Left lower leg: No edema.  Skin:    General: Skin is warm and dry.     Capillary Refill: Capillary refill takes less than 2 seconds.     Findings: No rash.  Neurological:     Mental Status: He is alert.  Psychiatric:        Mood and Affect: Mood normal.     ED Results / Procedures / Treatments   Labs (all labs ordered are listed, but only abnormal results are displayed) Labs Reviewed  SARS CORONAVIRUS 2 BY RT PCR  (HOSPITAL ORDER, PERFORMED IN [facility]) - Abnormal; Notable for the following components:      Result Value   SARS Coronavirus 2 POSITIVE (*)    All other components within normal limits  BASIC METABOLIC PANEL - Abnormal; Notable for the following components:   Glucose, Bld 100 (*)    Calcium 8.6 (*)    All other components within normal limits  CBC WITH DIFFERENTIAL/PLATELET - Abnormal; Notable for the following components:   WBC 3.2 (*)    All other components within normal limits    EKG None  Radiology DG Chest Port 1 View  Result Date: 03/08/2020 CLINICAL DATA:  COVID positivity with chest pain and shortness of breath EXAM: PORTABLE CHEST 1 VIEW COMPARISON:  None. FINDINGS: Cardiac shadow is within normal limits. Patchy opacities are identified in both lungs consistent with the given clinical history. No sizable effusion is seen. No bony abnormality is noted. IMPRESSION: Patchy airspace opacities consistent with the given clinical history. Electronically Signed   By: Alcide Clever M.D.   On: 03/08/2020 18:34    Procedures Procedures (including critical care time)  Medications Ordered in ED Medications  acetaminophen (TYLENOL) tablet 650 mg (650 mg Oral Given 03/08/20 1810)    ED Course  I have reviewed the triage vital signs and the nursing notes.  Pertinent labs & imaging results that were available during my care of the patient were reviewed by me and considered in my medical decision making (see chart for details).    MDM Rules/Calculators/A&P                          I have personally reviewed all imaging, labs and have interpreted them.  Patient presents with general malaise, fever, chills and cough.  Patient was alert and oriented did not appear to be in acute distress, vital signs reassuring.  Evaluated patient's oropharynx, no exudates or erythema noted, lung sounds were clear bilaterally no signs of respiratory distress noted, abdomen was nontender  to palpation, no pedal edema noted on exam.  Will order screening labs and chest x-ray for further evaluation.  Lab work shows Covid positive, BMP without electrolyte abnormalities, no signs of AKI, CBC shows no leukocytosis or signs of anemia.  Chest x-ray shows airspace opacities consistent with exam findings.  I have low suspicion for systemic fashion as patient was nontoxic-appearing, vital signs reassuring, no obvious source infection noted on exam, no leukocytosis seen on CBC.  Low suspicion that patient will need to be hospitalized due to Covid as he has no new oxygen requirements,  no signs of respiratory stress noted during exam, has little comorbidities that would increase his risk for an adverse outcome.  Low suspicion for cardiac abnormality as patient denies chest pain,no signs of hypoperfusion or fluid overload on exam.  Low suspicion for acute abdomen requiring surgical intervention as abdominal exam was benign tolerating p.o. without difficulty.  Patient appears to be resting comfortably showing no acute signs distress.  Vital signs have remained stable does not meet criteria to be admitted to the hospital.  Likely patient symptoms are result of being Covid positive, will recommend over-the-counter medications and following up with post Covid care for further management.  Patient was discussed with attending who agrees assessment and plan.  Patient was given at home care as well as strict return precautions.  Patient verbalized that he understood and agreed with said plan. Final Clinical Impression(s) / ED Diagnoses Final diagnoses:  COVID-19 virus infection    Rx / DC Orders ED Discharge Orders    None       Carroll Sage, PA-C 03/08/20 Drucilla Schmidt, MD 03/09/20 [phone]

## 2020-03-08 NOTE — ED Triage Notes (Signed)
Pt states that he lost his taste and smell on Sunday, recent exposure to covid,

## 2020-03-08 NOTE — ED Notes (Signed)
Date and time results received: 03/08/20 1721  Test: COVID  Critical Value: positive   Name of Provider Notified: Leontine Locket  Orders Received? Or Actions Taken?: n/a

## 2020-03-11 ENCOUNTER — Telehealth: Payer: Self-pay | Admitting: Unknown Physician Specialty

## 2020-03-11 NOTE — Telephone Encounter (Signed)
Called to Discuss with patient about Covid symptoms and the use of the monoclonal antibody infusion for those with mild to moderate Covid symptoms and at a high risk of hospitalization.     Pt appears to qualify for this infusion due to co-morbid conditions and/or a member of an at-risk group in accordance with the FDA Emergency Use Authorization.    Unable to reach pt and mailbox is full

## 2020-03-12 ENCOUNTER — Encounter (HOSPITAL_COMMUNITY): Payer: Self-pay

## 2020-03-12 ENCOUNTER — Other Ambulatory Visit: Payer: Self-pay

## 2020-03-12 ENCOUNTER — Emergency Department (HOSPITAL_COMMUNITY)
Admission: EM | Admit: 2020-03-12 | Discharge: 2020-03-12 | Disposition: A | Discharge status: Home / Self Care | Payer: 59 | Attending: Emergency Medicine | Admitting: Emergency Medicine

## 2020-03-12 DIAGNOSIS — R0602 Shortness of breath: Secondary | ICD-10-CM | POA: Diagnosis present

## 2020-03-12 DIAGNOSIS — U071 COVID-19: Secondary | ICD-10-CM | POA: Insufficient documentation

## 2020-03-12 LAB — CBC
HCT: 39.9 % (ref 39.0–52.0)
Hemoglobin: 13 g/dL (ref 13.0–17.0)
MCH: 27.1 pg (ref 26.0–34.0)
MCHC: 32.6 g/dL (ref 30.0–36.0)
MCV: 83.1 fL (ref 80.0–100.0)
Platelets: 212 10*3/uL (ref 150–400)
RBC: 4.8 MIL/uL (ref 4.22–5.81)
RDW: 14.7 % (ref 11.5–15.5)
WBC: 6.9 10*3/uL (ref 4.0–10.5)
nRBC: 0 % (ref 0.0–0.2)

## 2020-03-12 LAB — BASIC METABOLIC PANEL
Anion gap: 11 (ref 5–15)
BUN: 12 mg/dL (ref 6–20)
CO2: 25 mmol/L (ref 22–32)
Calcium: 8.1 mg/dL — ABNORMAL LOW (ref 8.9–10.3)
Chloride: 96 mmol/L — ABNORMAL LOW (ref 98–111)
Creatinine, Ser: 0.9 mg/dL (ref 0.61–1.24)
GFR calc Af Amer: >60 mL/min (ref >60)
GFR calc non Af Amer: >60 mL/min (ref >60)
Glucose, Bld: 106 mg/dL — ABNORMAL HIGH (ref 70–99)
Potassium: 3.2 mmol/L — ABNORMAL LOW (ref 3.5–5.1)
Sodium: 132 mmol/L — ABNORMAL LOW (ref 135–145)

## 2020-03-12 MED ORDER — ALBUTEROL SULFATE HFA 108 (90 BASE) MCG/ACT IN AERS: 2.0000 | INHALATION_SPRAY | Freq: Once | RESPIRATORY_TRACT | Status: DC | PRN

## 2020-03-12 MED ORDER — EPINEPHRINE 0.3 MG/0.3ML IJ SOAJ: 0.3000 mg | Freq: Once | INTRAMUSCULAR | Status: DC | PRN

## 2020-03-12 MED ORDER — SODIUM CHLORIDE 0.9 % IV SOLN: INTRAVENOUS | Status: DC | PRN

## 2020-03-12 MED ORDER — DIPHENHYDRAMINE HCL 50 MG/ML IJ SOLN: 50.0000 mg | Freq: Once | INTRAMUSCULAR | Status: DC | PRN

## 2020-03-12 MED ORDER — SODIUM CHLORIDE 0.9 % IV BOLUS
1000.0000 mL | Freq: Once | INTRAVENOUS | Status: AC
  Administered 2020-03-12: 1000 mL via INTRAVENOUS

## 2020-03-12 MED ORDER — ONDANSETRON 4 MG PO TBDP: 4.0000 mg | ORAL_TABLET | Freq: Three times a day (TID) | ORAL | 0 refills | Status: DC | PRN

## 2020-03-12 MED ORDER — FAMOTIDINE IN NACL 20-0.9 MG/50ML-% IV SOLN: 20.0000 mg | Freq: Once | INTRAVENOUS | Status: DC | PRN

## 2020-03-12 MED ORDER — SODIUM CHLORIDE 0.9 % IV SOLN
1200.0000 mg | Freq: Once | INTRAVENOUS | Status: AC
  Administered 2020-03-12: 1200 mg via INTRAVENOUS
  Filled 2020-03-12 (×2): qty 10

## 2020-03-12 MED ORDER — METHYLPREDNISOLONE SODIUM SUCC 125 MG IJ SOLR: 125.0000 mg | Freq: Once | INTRAMUSCULAR | Status: DC | PRN

## 2020-03-12 NOTE — ED Triage Notes (Signed)
Pt reports diagnosed with covid last week.  C/O worsening sob with exertion today and vomiting.  Denies diarrhea.

## 2020-03-12 NOTE — ED Provider Notes (Signed)
[facility] EMERGENCY DEPARTMENT Provider Note   CSN: [id number] Arrival date & time: 03/12/20  5188     History Chief Complaint  Patient presents with  . Daniel West is a 59 y.o. male.  HPI Patient presents with worsening shortness of breath and fatigue.  Known Covid infection diagnosis around 5 days ago.  For symptoms began last Sunday with today being Sunday also.  Decreased oral intake.  States he has not vomiting but does not feel like eating.  Really has not ate anything in the last couple days.  Reviewing records had been contacted about monoclonal antibody treatment but they were unable to get through to him.  Still having some fevers at times.  States his sense of taste has come back.  Patient did not get his Covid vaccinations.    History reviewed. No pertinent past medical history.  There are no problems to display for this patient.   Past Surgical History:  Procedure Laterality Date  . CHOLECYSTECTOMY         Family History  Problem Relation Age of Onset  . Diabetes Mother     Social History   Tobacco Use  . Smoking status: Never Smoker  . Smokeless tobacco: Never Used  Substance Use Topics  . Alcohol use: No  . Drug use: No    Home Medications Prior to Admission medications   Medication Sig Start Date End Date Taking? Authorizing Provider  meloxicam (MOBIC) 7.5 MG tablet Take 1 tablet (7.5 mg total) by mouth daily. 09/14/19   Vickki Hearing, MD  naproxen (NAPROSYN) 500 MG tablet Take 1 tablet (500 mg total) by mouth 2 (two) times daily with a meal. 09/12/19   Eber Hong, MD  ondansetron (ZOFRAN-ODT) 4 MG disintegrating tablet Take 1 tablet (4 mg total) by mouth every 8 (eight) hours as needed for nausea or vomiting. 03/12/20   Benjiman Core, MD    Allergies    Patient has no known allergies.  Review of Systems   Review of Systems  Constitutional: Positive for appetite change, fatigue and fever.  HENT: Negative for  congestion.   Respiratory: Positive for cough and shortness of breath.   Cardiovascular: Negative for chest pain and leg swelling.  Gastrointestinal: Negative for abdominal pain.  Genitourinary: Negative for flank pain.  Musculoskeletal: Negative for back pain.  Skin: Negative for rash.  Neurological: Negative for weakness.    Physical Exam Updated Vital Signs BP 129/67   Pulse 90   Temp 99.6 F (37.6 C)   Resp (!) 38   Ht 5\' 11"  (1.803 m)   Wt 116.6 kg   SpO2 92%   BMI 35.84 kg/m   Physical Exam Vitals and nursing note reviewed.  HENT:     Head: Normocephalic.  Eyes:     Extraocular Movements: Extraocular movements intact.  Cardiovascular:     Rate and Rhythm: Normal rate and regular rhythm.  Pulmonary:     Breath sounds: No wheezing, rhonchi or rales.  Abdominal:     Tenderness: There is no abdominal tenderness.  Musculoskeletal:        General: No tenderness.  Skin:    General: Skin is warm.     Capillary Refill: Capillary refill takes less than 2 seconds.  Neurological:     Mental Status: He is alert and oriented to person, place, and time.     ED Results / Procedures / Treatments   Labs (all labs ordered are listed, but  only abnormal results are displayed) Labs Reviewed  BASIC METABOLIC PANEL - Abnormal; Notable for the following components:      Result Value   Sodium 132 (*)    Potassium 3.2 (*)    Chloride 96 (*)    Glucose, Bld 106 (*)    Calcium 8.1 (*)    All other components within normal limits  CBC    EKG EKG Interpretation  Date/Time:  Sunday March 12 2020 09:03:55 EDT Ventricular Rate:  91 PR Interval:    QRS Duration: 85 QT Interval:  365 QTC Calculation: 450 R Axis:   78 Text Interpretation: Sinus rhythm Nonspecific repol abnormality, inferior leads Borderline ST elevation, lateral leads Baseline wander in lead(s) V1 Confirmed by Leighann Amadon ([id number]) on 03/12/2020 10:00:00 AM   Radiology No results  found.  Procedures Procedures (including critical care time)  Medications Ordered in ED Medications  0.9 %  sodium chloride infusion (has no administration in time range)  diphenhydrAMINE (BENADRYL) injection 50 mg (has no administration in time range)  famotidine (PEPCID) IVPB 20 mg premix (has no administration in time range)  methylPREDNISolone sodium succinate (SOLU-MEDROL) 125 mg/2 mL injection 125 mg (has no administration in time range)  albuterol (VENTOLIN HFA) 108 (90 Base) MCG/ACT inhaler 2 puff (has no administration in time range)  EPINEPHrine (EPI-PEN) injection 0.3 mg (has no administration in time range)  casirivimab-imdevimab (REGEN-COV) 1,200 mg in sodium chloride 0.9 % 110 mL IVPB (0 mg Intravenous Stopped 03/12/20 1238)  sodium chloride 0.9 % bolus 1,000 mL (1,000 mLs Intravenous New Bag/Given 03/12/20 1217)    ED Course  I have reviewed the triage vital signs and the nursing notes.  Pertinent labs & imaging results that were available during my care of the patient were reviewed by me and considered in my medical decision making (see chart for details).    MDM Rules/Calculators/A&P                          Patient presents with known Covid infection.  Diagnosed last week.  More short of breath.  Does have decreased oral intake.  Not vomiting.  Lab work reassuring.  Patient is a candidate for monoclonal antibody therapy and was treated in the ER.  Not hypoxic.  Able ambulate.  Able to complete sentences.  Will discharge home.  Fluid boluses been given also. Final Clinical Impression(s) / ED Diagnoses Final diagnoses:  COVID-19    Rx / DC Orders ED Discharge Orders         Ordered    ondansetron (ZOFRAN-ODT) 4 MG disintegrating tablet  Every 8 hours PRN        08 /29/21 1354           [id number], MD 03/12/20 1355

## 2020-03-12 NOTE — Discharge Instructions (Signed)
Try and keep yourself hydrated.  Return for worsening shortness of breath.

## 2020-03-23 ENCOUNTER — Other Ambulatory Visit: Payer: Self-pay

## 2020-03-23 ENCOUNTER — Other Ambulatory Visit: Payer: 59

## 2020-03-23 DIAGNOSIS — Z20822 Contact with and (suspected) exposure to covid-19: Secondary | ICD-10-CM

## 2020-03-25 LAB — SARS-COV-2, NAA 2 DAY TAT

## 2020-03-25 LAB — NOVEL CORONAVIRUS, NAA: SARS-CoV-2, NAA: NOT DETECTED

## 2020-08-07 ENCOUNTER — Encounter (HOSPITAL_COMMUNITY): Payer: Self-pay | Admitting: *Deleted

## 2020-08-07 ENCOUNTER — Emergency Department (HOSPITAL_COMMUNITY)
Admission: EM | Admit: 2020-08-07 | Discharge: 2020-08-07 | Disposition: A | Discharge status: Home / Self Care | Payer: BC Managed Care – PPO | Attending: Emergency Medicine | Admitting: Emergency Medicine

## 2020-08-07 ENCOUNTER — Other Ambulatory Visit: Payer: Self-pay

## 2020-08-07 DIAGNOSIS — K047 Periapical abscess without sinus: Secondary | ICD-10-CM | POA: Diagnosis not present

## 2020-08-07 DIAGNOSIS — K0889 Other specified disorders of teeth and supporting structures: Secondary | ICD-10-CM | POA: Diagnosis not present

## 2020-08-07 MED ORDER — AMOXICILLIN 500 MG PO CAPS: 500.0000 mg | ORAL_CAPSULE | Freq: Three times a day (TID) | ORAL | 0 refills | Status: AC

## 2020-08-07 MED ORDER — HYDROCODONE-ACETAMINOPHEN 5-325 MG PO TABS: 1.0000 | ORAL_TABLET | ORAL | 0 refills | Status: DC | PRN

## 2020-08-07 NOTE — ED Notes (Signed)
Entered room and introduced self to patient. Pt appears to be resting in bed, respirations are even and unlabored with equal chest rise and fall. Call bell within reach. Pt educated on call light use and hourly rounding, verbalized understanding and in agreement at this time. All questions and concerns voiced addressed. Refreshments offered and provided per patient request.  

## 2020-08-07 NOTE — ED Notes (Signed)
PA at bedside at this time.  

## 2020-08-07 NOTE — Discharge Instructions (Signed)
Complete your entire course of antibiotics as prescribed.  You  may use the hydrocodone for pain relief but do not drive within 4 hours of taking as this will make you drowsy.  Avoid applying heat or ice to this abscess area which can worsen your symptoms. Continue taking your ibuprofen taking 3 tablets (600 mg) every 8 hours.  You may use 1/4  peroxide and water swish and spit after meals to keep this area clean as discussed.  Keep your appointment with your dentist next week.  Return here sooner if you develop worsening symptoms or the antibiotic is not starting to make the infection better (remember this will take 24 hours before you start noticing improvement).

## 2020-08-07 NOTE — ED Notes (Signed)
Dental pain with facial swelling.

## 2020-08-07 NOTE — ED Provider Notes (Signed)
[facility] EMERGENCY DEPARTMENT Provider Note   CSN: [id number] Arrival date & time: 08/07/20  1215     History Chief Complaint  Patient presents with  . Dental Pain  . Facial Swelling    Daniel West is a 60 y.o. male.  The history is provided by the patient.  Dental Pain Location:  Lower Lower teeth location:  29/RL 2nd bicuspid Quality:  Constant and throbbing Severity:  Severe Onset quality:  Gradual Duration:  1 day Timing:  Constant Progression:  Worsening Chronicity:  New Context: abscess and dental caries   Relieved by:  NSAIDs (minimal relief from ibu 400 mg) Worsened by:  Pressure, touching and cold food/drink Ineffective treatments:  None tried Associated symptoms: facial swelling and gum swelling   Associated symptoms: no fever, no neck pain, no neck swelling and no trismus        History reviewed. No pertinent past medical history.  There are no problems to display for this patient.   Past Surgical History:  Procedure Laterality Date  . CHOLECYSTECTOMY         Family History  Problem Relation Age of Onset  . Diabetes Mother     Social History   Tobacco Use  . Smoking status: Never Smoker  . Smokeless tobacco: Never Used  Substance Use Topics  . Alcohol use: No  . Drug use: No    Home Medications Prior to Admission medications   Medication Sig Start Date End Date Taking? Authorizing Provider  amoxicillin (AMOXIL) 500 MG capsule Take 1 capsule (500 mg total) by mouth 3 (three) times daily for 10 days. 08/07/20 08/17/20 Yes Neshawn Aird, Raynelle Fanning, PA-C  HYDROcodone-acetaminophen (NORCO/VICODIN) 5-325 MG tablet Take 1 tablet by mouth every 4 (four) hours as needed for moderate pain. 08/07/20  Yes Kaiesha Tonner, Raynelle Fanning, PA-C  meloxicam (MOBIC) 7.5 MG tablet Take 1 tablet (7.5 mg total) by mouth daily. 09/14/19   Vickki Hearing, MD  naproxen (NAPROSYN) 500 MG tablet Take 1 tablet (500 mg total) by mouth 2 (two) times daily with a meal. 09/12/19   Eber Hong, MD  ondansetron (ZOFRAN-ODT) 4 MG disintegrating tablet Take 1 tablet (4 mg total) by mouth every 8 (eight) hours as needed for nausea or vomiting. 03/12/20   Benjiman Core, MD    Allergies    Patient has no known allergies.  Review of Systems   Review of Systems  Constitutional: Negative for chills and fever.  HENT: Positive for dental problem and facial swelling. Negative for sore throat and trouble swallowing.   Cardiovascular: Negative.   Gastrointestinal: Negative.   Musculoskeletal: Negative for neck pain.  All other systems reviewed and are negative.   Physical Exam Updated Vital Signs BP (!) 152/96 (BP Location: Right Arm)   Pulse 77   Temp 99.2 F (37.3 C) (Oral)   Resp 18   Ht 5\' 11"  (1.803 m)   Wt 117.9 kg   SpO2 97%   BMI 36.26 kg/m   Physical Exam Constitutional:      General: He is not in acute distress.    Appearance: He is well-developed and well-nourished.  HENT:     Head: Normocephalic and atraumatic.     Jaw: No trismus.     Right Ear: Tympanic membrane and external ear normal.     Left Ear: Tympanic membrane and external ear normal.     Mouth/Throat:     Mouth: Oropharynx is clear and moist and mucous membranes are normal. No oral lesions.  Dentition: Dental abscesses present.     Comments: Induration right lateral jaw with mild gingival induration right lower.  No trismus, sublingual space soft.  No gingival pointing or tenting.  Right lower 2nd premolar tttp, suspected source - apical likely.  Hint of decay on posterior aspect of this tooth. Other dentition healthy.  Eyes:     Conjunctiva/sclera: Conjunctivae normal.  Cardiovascular:     Rate and Rhythm: Normal rate.     Heart sounds: Normal heart sounds.  Pulmonary:     Effort: Pulmonary effort is normal.  Musculoskeletal:        General: Normal range of motion.     Cervical back: Normal range of motion and neck supple.  Lymphadenopathy:     Cervical: No cervical adenopathy.   Skin:    General: Skin is warm and dry.     Findings: No erythema.  Neurological:     Mental Status: He is alert and oriented to person, place, and time.  Psychiatric:        Mood and Affect: Mood and affect normal.     ED Results / Procedures / Treatments   Labs (all labs ordered are listed, but only abnormal results are displayed) Labs Reviewed - No data to display  EKG None  Radiology No results found.  Procedures Procedures   Medications Ordered in ED Medications - No data to display  ED Course  I have reviewed the triage vital signs and the nursing notes.  Pertinent labs & imaging results that were available during my care of the patient were reviewed by me and considered in my medical decision making (see chart for details).    MDM Rules/Calculators/A&P                          Pt with dental infection/abscess but no identifiable source for I&D.  Amoxil started, discussed other home care, close f/u with dentistry (has appt with his dentist in [place] next week).  Strict return precautions outlined. Final Clinical Impression(s) / ED Diagnoses Final diagnoses:  Dental abscess    Rx / DC Orders ED Discharge Orders         Ordered    HYDROcodone-acetaminophen (NORCO/VICODIN) 5-325 MG tablet  Every 4 hours PRN        08/07/20 1359    amoxicillin (AMOXIL) 500 MG capsule  3 times daily        08/07/20 1359           Burgess Amor, PA-C 08/07/20 1401    Vanetta Mulders, MD 08/20/20 [phone]

## 2020-10-25 DIAGNOSIS — Z6838 Body mass index (BMI) 38.0-38.9, adult: Secondary | ICD-10-CM | POA: Diagnosis not present

## 2020-10-25 DIAGNOSIS — Z0000 Encounter for general adult medical examination without abnormal findings: Secondary | ICD-10-CM | POA: Diagnosis not present

## 2020-10-25 DIAGNOSIS — Z1331 Encounter for screening for depression: Secondary | ICD-10-CM | POA: Diagnosis not present

## 2021-12-24 ENCOUNTER — Encounter (HOSPITAL_COMMUNITY): Payer: Self-pay | Admitting: Emergency Medicine

## 2021-12-24 ENCOUNTER — Emergency Department (HOSPITAL_COMMUNITY)
Admission: EM | Admit: 2021-12-24 | Discharge: 2021-12-24 | Disposition: A | Discharge status: Home / Self Care | Payer: BC Managed Care – PPO | Attending: Emergency Medicine | Admitting: Emergency Medicine

## 2021-12-24 ENCOUNTER — Other Ambulatory Visit: Payer: Self-pay

## 2021-12-24 ENCOUNTER — Emergency Department (HOSPITAL_COMMUNITY): Payer: BC Managed Care – PPO

## 2021-12-24 DIAGNOSIS — R079 Chest pain, unspecified: Secondary | ICD-10-CM | POA: Diagnosis not present

## 2021-12-24 DIAGNOSIS — X500XXA Overexertion from strenuous movement or load, initial encounter: Secondary | ICD-10-CM | POA: Insufficient documentation

## 2021-12-24 DIAGNOSIS — M546 Pain in thoracic spine: Secondary | ICD-10-CM | POA: Insufficient documentation

## 2021-12-24 LAB — URINALYSIS, ROUTINE W REFLEX MICROSCOPIC
Bilirubin Urine: NEGATIVE
Glucose, UA: NEGATIVE mg/dL
Hgb urine dipstick: NEGATIVE
Ketones, ur: NEGATIVE mg/dL
Leukocytes,Ua: NEGATIVE
Nitrite: NEGATIVE
Protein, ur: NEGATIVE mg/dL
Specific Gravity, Urine: 1.025 (ref 1.005–1.030)
pH: 6 (ref 5.0–8.0)

## 2021-12-24 LAB — CBC WITH DIFFERENTIAL/PLATELET
Abs Immature Granulocytes: 0.01 10*3/uL (ref 0.00–0.07)
Basophils Absolute: 0 10*3/uL (ref 0.0–0.1)
Basophils Relative: 1 %
Eosinophils Absolute: 0 10*3/uL (ref 0.0–0.5)
Eosinophils Relative: 1 %
HCT: 43 % (ref 39.0–52.0)
Hemoglobin: 13.8 g/dL (ref 13.0–17.0)
Immature Granulocytes: 0 %
Lymphocytes Relative: 29 %
Lymphs Abs: 1.4 10*3/uL (ref 0.7–4.0)
MCH: 27.1 pg (ref 26.0–34.0)
MCHC: 32.1 g/dL (ref 30.0–36.0)
MCV: 84.5 fL (ref 80.0–100.0)
Monocytes Absolute: 0.6 10*3/uL (ref 0.1–1.0)
Monocytes Relative: 13 %
Neutro Abs: 2.7 10*3/uL (ref 1.7–7.7)
Neutrophils Relative %: 56 %
Platelets: 221 10*3/uL (ref 150–400)
RBC: 5.09 MIL/uL (ref 4.22–5.81)
RDW: 14.6 % (ref 11.5–15.5)
WBC: 4.8 10*3/uL (ref 4.0–10.5)
nRBC: 0 % (ref 0.0–0.2)

## 2021-12-24 LAB — BASIC METABOLIC PANEL
Anion gap: 7 (ref 5–15)
BUN: 16 mg/dL (ref 6–20)
CO2: 26 mmol/L (ref 22–32)
Calcium: 8.9 mg/dL (ref 8.9–10.3)
Chloride: 106 mmol/L (ref 98–111)
Creatinine, Ser: 1.09 mg/dL (ref 0.61–1.24)
GFR, Estimated: >60 mL/min (ref >60)
Glucose, Bld: 90 mg/dL (ref 70–99)
Potassium: 4.3 mmol/L (ref 3.5–5.1)
Sodium: 139 mmol/L (ref 135–145)

## 2021-12-24 MED ORDER — METHOCARBAMOL 500 MG PO TABS: 500.0000 mg | ORAL_TABLET | Freq: Three times a day (TID) | ORAL | 0 refills | Status: DC

## 2021-12-24 MED ORDER — HYDROCODONE-ACETAMINOPHEN 5-325 MG PO TABS: ORAL_TABLET | ORAL | 0 refills | Status: DC

## 2021-12-24 MED ORDER — NAPROXEN 500 MG PO TABS: 500.0000 mg | ORAL_TABLET | Freq: Two times a day (BID) | ORAL | 0 refills | Status: DC

## 2021-12-24 NOTE — ED Triage Notes (Signed)
Pt having right lowe back discomfort for 1.5 weeks, may have pulled muscle while working.

## 2021-12-24 NOTE — ED Provider Notes (Signed)
[facility] EMERGENCY DEPARTMENT Provider Note   CSN: [id number] Arrival date & time: 12/24/21  1642     History  Chief Complaint  Patient presents with   Back Pain    Daniel West is a 61 y.o. male.   Back Pain Associated symptoms: no abdominal pain, no chest pain, no dysuria, no fever, no headaches, no numbness and no weakness        Daniel West is a 61 y.o. male who presents to the Emergency Department complaining of right-sided mid back pain x1-1/2 weeks.  States he was loading something onto a truck when he felt a sharp tingling type pain of his right back.  Since that time, he has intermittent pains that is reproduced with certain movements.  Pain is not constant.  He denies pain with sitting or at rest.  States pain seems to be mostly triggered by bending and lifting.  He denies any chest pain, shortness of breath, cough, pain numbness or weakness of his extremities, dysuria, fever or chills.  He has not tried any medications or other therapies prior to arrival.    Home Medications Prior to Admission medications   Medication Sig Start Date End Date Taking? Authorizing Provider  HYDROcodone-acetaminophen (NORCO/VICODIN) 5-325 MG tablet Take 1 tablet by mouth every 4 (four) hours as needed for moderate pain. 08/07/20   Burgess Amor, PA-C  meloxicam (MOBIC) 7.5 MG tablet Take 1 tablet (7.5 mg total) by mouth daily. 09/14/19   Vickki Hearing, MD  naproxen (NAPROSYN) 500 MG tablet Take 1 tablet (500 mg total) by mouth 2 (two) times daily with a meal. 09/12/19   Eber Hong, MD  ondansetron (ZOFRAN-ODT) 4 MG disintegrating tablet Take 1 tablet (4 mg total) by mouth every 8 (eight) hours as needed for nausea or vomiting. 03/12/20   Benjiman Core, MD      Allergies    Patient has no known allergies.    Review of Systems   Review of Systems  Constitutional:  Negative for appetite change, chills and fever.  Respiratory:  Negative for shortness of breath.    Cardiovascular:  Negative for chest pain.  Gastrointestinal:  Negative for abdominal pain, diarrhea, nausea and vomiting.  Genitourinary:  Negative for decreased urine volume, difficulty urinating and dysuria.  Musculoskeletal:  Positive for back pain.  Skin:  Negative for rash.  Neurological:  Negative for dizziness, syncope, speech difficulty, weakness, numbness and headaches.    Physical Exam Updated Vital Signs BP 129/79 (BP Location: Right Arm)   Pulse 77   Temp 98.3 F (36.8 C) (Oral)   Resp 17   Ht 5\' 11"  (1.803 m)   Wt 115.2 kg   SpO2 98%   BMI 35.43 kg/m  Physical Exam Vitals and nursing note reviewed.  Constitutional:      General: He is not in acute distress.    Appearance: Normal appearance. He is not ill-appearing or toxic-appearing.  HENT:     Mouth/Throat:     Mouth: Mucous membranes are moist.  Cardiovascular:     Rate and Rhythm: Normal rate and regular rhythm.     Pulses: Normal pulses.  Pulmonary:     Effort: Pulmonary effort is normal.  Abdominal:     General: There is no distension.     Palpations: Abdomen is soft.     Tenderness: There is no abdominal tenderness.  Musculoskeletal:        General: Tenderness and signs of injury present. No swelling or deformity.  Thoracic back: Tenderness present. No bony tenderness. Normal range of motion.       Back:     Comments: Patient has focal tenderness to palpation of the right thoracic paraspinal muscles.  There is no bony deformity or midline tenderness on exam.  No skin changes.  Skin:    General: Skin is warm.     Capillary Refill: Capillary refill takes less than 2 seconds.     Findings: No erythema or rash.  Neurological:     General: No focal deficit present.     Mental Status: He is alert.     Sensory: No sensory deficit.     Motor: No weakness.     ED Results / Procedures / Treatments   Labs (all labs ordered are listed, but only abnormal results are displayed) Labs Reviewed   URINALYSIS, ROUTINE W REFLEX MICROSCOPIC  BASIC METABOLIC PANEL  CBC WITH DIFFERENTIAL/PLATELET    EKG None  Radiology DG Ribs Unilateral W/Chest Right  Result Date: 12/24/2021 CLINICAL DATA:  Right-sided chest pain after picking up boxes last week EXAM: RIGHT RIBS AND CHEST - 3+ VIEW COMPARISON:  Chest radiograph 03/08/2020 FINDINGS: The cardiomediastinal silhouette is normal. There is no focal consolidation or pulmonary edema. There is no pleural effusion or pneumothorax There is no displaced rib fracture or other acute osseous abnormality. IMPRESSION: No displaced rib fracture or other acute osseous abnormality identified. Electronically Signed   By: Lesia Hausen M.D.   On: 12/24/2021 17:38    Procedures Procedures    Medications Ordered in ED Medications - No data to display  ED Course/ Medical Decision Making/ A&P                           Medical Decision Making Patient here for evaluation of right-sided mid back pain that began after heavy lifting.  He felt pain at onset that has not improved.  Pain is not constant, reproduced with certain movements.  Denies pain at rest.  No shortness of breath, chest or abdominal pain.  On exam, patient is well-appearing nontoxic.  Vital signs are reassuring.  He is ambulatory with steady gait.  No focal neurodeficits on my exam.  He does have localized tenderness of the right thoracic paraspinal muscles.  I feel like his pain is musculoskeletal.  Patient concerned this may be related to his kidneys, I have low clinical suspicion for this given he has no dysuria symptoms and pain is not constant. Low suspicion for ACS, dissection, PE, and PNA  Amount and/or Complexity of Data Reviewed Labs: ordered.    Details: Labs interpreted by me, unremarkable Radiology: ordered.    Details: Rib and chest film without evidence of displaced rib fracture or bony abnormality.  No focal consolidation or pulmonary edema.  Agree with radiology  interpretation.  Risk Prescription drug management. Risk Details: Work-up today reassuring.  Likely musculoskeletal pain.  Patient reassured, will treat symptomatically and he will follow-up with PCP.  Appears appropriate for discharge home, return precautions were discussed.           Final Clinical Impression(s) / ED Diagnoses Final diagnoses:  Acute right-sided thoracic back pain    Rx / DC Orders ED Discharge Orders     None         Rosey Bath 12/26/21 0840    Cheryll Cockayne, MD 12/31/21 1659

## 2021-12-24 NOTE — Discharge Instructions (Signed)
Your work-up today was reassuring.  I recommend that you alternate ice and heat to the affected area of your back.  Avoid heavy lifting or bending movements for at least 1 week.  Take the medication as directed.  Follow-up with your primary care provider for recheck in 1 week if not improving.

## 2022-06-30 ENCOUNTER — Other Ambulatory Visit: Payer: Self-pay

## 2022-06-30 ENCOUNTER — Emergency Department (HOSPITAL_COMMUNITY): Payer: BC Managed Care – PPO

## 2022-06-30 ENCOUNTER — Emergency Department (HOSPITAL_COMMUNITY)
Admission: EM | Admit: 2022-06-30 | Discharge: 2022-06-30 | Disposition: A | Discharge status: Home / Self Care | Payer: BC Managed Care – PPO | Attending: Emergency Medicine | Admitting: Emergency Medicine

## 2022-06-30 DIAGNOSIS — X58XXXA Exposure to other specified factors, initial encounter: Secondary | ICD-10-CM | POA: Diagnosis not present

## 2022-06-30 DIAGNOSIS — S8392XA Sprain of unspecified site of left knee, initial encounter: Secondary | ICD-10-CM | POA: Insufficient documentation

## 2022-06-30 DIAGNOSIS — M25562 Pain in left knee: Secondary | ICD-10-CM | POA: Diagnosis not present

## 2022-06-30 DIAGNOSIS — M1712 Unilateral primary osteoarthritis, left knee: Secondary | ICD-10-CM | POA: Diagnosis not present

## 2022-06-30 MED ORDER — NAPROXEN 250 MG PO TABS
500.0000 mg | ORAL_TABLET | Freq: Once | ORAL | Status: AC
  Administered 2022-06-30: 500 mg via ORAL
  Filled 2022-06-30: qty 2

## 2022-06-30 MED ORDER — NAPROXEN 500 MG PO TABS: 500.0000 mg | ORAL_TABLET | Freq: Two times a day (BID) | ORAL | 0 refills | Status: AC

## 2022-06-30 NOTE — ED Provider Notes (Signed)
[facility] EMERGENCY DEPARTMENT Provider Note   CSN: [id number] Arrival date & time: 06/30/22  1222     History  Chief Complaint  Patient presents with   Knee Pain    Daniel West is a 61 y.o. male.   Knee Pain Associated symptoms: no fever   This patient is a 61 year old male no chronic medical conditions presenting with a complaint of left-sided knee pain which has been present for some time, he states that it hurts sometimes when he fully extends or fully bends it, sometimes there is a click or pop, he does not recall an injury and denies any swelling.  No fevers, no chills, he has not been seen for this complaint by his family doctor     Home Medications Prior to Admission medications   Medication Sig Start Date End Date Taking? Authorizing Provider  naproxen (NAPROSYN) 500 MG tablet Take 1 tablet (500 mg total) by mouth 2 (two) times daily with a meal. 06/30/22  Yes Eber Hong, MD      Allergies    Patient has no known allergies.    Review of Systems   Review of Systems  Constitutional:  Negative for fever.  Musculoskeletal:  Positive for arthralgias.    Physical Exam Updated Vital Signs BP (!) 142/91 (BP Location: Right Arm)   Pulse 72   Temp 98.4 F (36.9 C) (Oral)   Resp 18   Ht 1.803 m (5\' 11" )   Wt 111.1 kg   SpO2 100%   BMI 34.17 kg/m  Physical Exam Vitals and nursing note reviewed.  Constitutional:      Appearance: He is well-developed. He is not diaphoretic.  HENT:     Head: Normocephalic and atraumatic.  Eyes:     General:        Right eye: No discharge.        Left eye: No discharge.     Conjunctiva/sclera: Conjunctivae normal.  Pulmonary:     Effort: Pulmonary effort is normal. No respiratory distress.  Musculoskeletal:     Right lower leg: No edema.     Left lower leg: No edema.     Comments: There is no signs of joint effusion, he has a very supple left knee joint, the patella has good movement, there is no tenderness over  the tibial tendon or the patella, no redness or warmth or swelling.  He is able to fully range the knee only with pain at the extremes.  He does have pain with lateral pressure on the medial surface, there is no pain over the lateral surface.  He has a stable anterior posterior exam with normal pulses at the feet  Skin:    General: Skin is warm and dry.     Findings: No erythema or rash.  Neurological:     Mental Status: He is alert.     Coordination: Coordination normal.     ED Results / Procedures / Treatments   Labs (all labs ordered are listed, but only abnormal results are displayed) Labs Reviewed - No data to display  EKG None  Radiology DG Knee Complete 4 Views Left  Result Date: 06/30/2022 CLINICAL DATA:  pain EXAM: LEFT KNEE - COMPLETE 4 VIEW COMPARISON:  None Available. FINDINGS: No evidence of fracture, dislocation, or joint effusion. Mild tricompartmental degenerative changes with small osteophytes. No evidence of effusion. IMPRESSION: Mild degenerative changes. No acute osseous abnormalities. Electronically Signed   By: 07/02/2022 M.D.   On: 06/30/2022 13:01  Procedures Procedures    Medications Ordered in ED Medications  naproxen (NAPROSYN) tablet 500 mg (has no administration in time range)    ED Course/ Medical Decision Making/ A&P                           Medical Decision Making Amount and/or Complexity of Data Reviewed Radiology: ordered.  Risk Prescription drug management.   This patient presents to the ED for concern of knee pain differential diagnosis includes fracture, strain, hemarthrosis, effusion, less likely to be septic joint, less likely to be gout     Imaging Studies ordered:  I ordered imaging studies including x-rays of the knee I independently visualized and interpreted imaging which showed no significant fractures dislocations, just some degenerative changes I agree with the radiologist interpretation   Medicines  ordered and prescription drug management:  I ordered medication including Naprosyn and a knee immobilizer for knee pain Reevaluation of the patient after these medicines showed that the patient improved I have reviewed the patients home medicines and have made adjustments as needed   Problem List / ED Course:  Knee pain, referred to orthopedics   Social Determinants of Health:  None           Final Clinical Impression(s) / ED Diagnoses Final diagnoses:  Sprain of left knee, unspecified ligament, initial encounter    Rx / DC Orders ED Discharge Orders          Ordered    naproxen (NAPROSYN) 500 MG tablet  2 times daily with meals        06/30/22 1343              Eber Hong, MD 06/30/22 1346

## 2022-06-30 NOTE — ED Notes (Signed)
Patient transported to X-ray 

## 2022-06-30 NOTE — Discharge Instructions (Signed)
Please take Naprosyn, 500mg  by mouth twice daily as needed for pain - this in an antiinflammatory medicine (NSAID) and is similar to ibuprofen - many people feel that it is stronger than ibuprofen and it is easier to take since it is a smaller pill.  Please use this only for 1 week - if your pain persists, you will need to follow up with your doctor in the office for ongoing guidance and pain control.  Please follow-up with the orthopedic surgeon within the next week, please see the phone number above for Dr. office  You may wear the knee immobilizer for comfort, you may apply ice on and off for swelling

## 2022-06-30 NOTE — ED Triage Notes (Signed)
Pt c/o pain in left knee for the past 2 months.  Denies any known injury.  Denies any swelling, redness, or deformity.

## 2023-04-08 DIAGNOSIS — E6609 Other obesity due to excess calories: Secondary | ICD-10-CM | POA: Diagnosis not present

## 2023-04-08 DIAGNOSIS — R7309 Other abnormal glucose: Secondary | ICD-10-CM | POA: Diagnosis not present

## 2023-04-08 DIAGNOSIS — Z6836 Body mass index (BMI) 36.0-36.9, adult: Secondary | ICD-10-CM | POA: Diagnosis not present

## 2023-04-08 DIAGNOSIS — Z1331 Encounter for screening for depression: Secondary | ICD-10-CM | POA: Diagnosis not present

## 2023-04-08 DIAGNOSIS — Z0000 Encounter for general adult medical examination without abnormal findings: Secondary | ICD-10-CM | POA: Diagnosis not present

## 2024-04-01 ENCOUNTER — Other Ambulatory Visit: Payer: Self-pay

## 2024-04-01 ENCOUNTER — Emergency Department (HOSPITAL_COMMUNITY)

## 2024-04-01 ENCOUNTER — Encounter (HOSPITAL_COMMUNITY): Payer: Self-pay | Admitting: Emergency Medicine

## 2024-04-01 ENCOUNTER — Emergency Department (HOSPITAL_COMMUNITY)
Admission: EM | Admit: 2024-04-01 | Discharge: 2024-04-01 | Disposition: A | Discharge status: Home / Self Care | Attending: Emergency Medicine | Admitting: Emergency Medicine

## 2024-04-01 DIAGNOSIS — W231XXA Caught, crushed, jammed, or pinched between stationary objects, initial encounter: Secondary | ICD-10-CM | POA: Diagnosis not present

## 2024-04-01 DIAGNOSIS — Y92812 Truck as the place of occurrence of the external cause: Secondary | ICD-10-CM | POA: Diagnosis not present

## 2024-04-01 DIAGNOSIS — Z23 Encounter for immunization: Secondary | ICD-10-CM | POA: Insufficient documentation

## 2024-04-01 DIAGNOSIS — M79641 Pain in right hand: Secondary | ICD-10-CM | POA: Insufficient documentation

## 2024-04-01 DIAGNOSIS — M19041 Primary osteoarthritis, right hand: Secondary | ICD-10-CM | POA: Diagnosis not present

## 2024-04-01 DIAGNOSIS — S6991XA Unspecified injury of right wrist, hand and finger(s), initial encounter: Secondary | ICD-10-CM | POA: Diagnosis not present

## 2024-04-01 MED ORDER — IBUPROFEN 800 MG PO TABS: 800.0000 mg | ORAL_TABLET | Freq: Three times a day (TID) | ORAL | 0 refills | Status: AC | PRN

## 2024-04-01 MED ORDER — TETANUS-DIPHTH-ACELL PERTUSSIS 5-2.5-18.5 LF-MCG/0.5 IM SUSY
0.5000 mL | PREFILLED_SYRINGE | Freq: Once | INTRAMUSCULAR | Status: AC
  Administered 2024-04-01: 0.5 mL via INTRAMUSCULAR
  Filled 2024-04-01: qty 0.5

## 2024-04-01 NOTE — ED Provider Notes (Signed)
 [place] EMERGENCY DEPARTMENT AT [facility] Provider Note   CSN: [id number] Arrival date & time: 04/01/24  9272     Patient presents with: Hand Pain   Daniel West is a 63 y.o. male with no PMHX. Patient reports he was moving metal from his truck to recycling facility last night when he right hand was caught on the metal. He denies skin puncture or bleeding but reports his pain is a 7/10 when moving the hand, and 3/10 at rest. Last tdap unknown.    Hand Pain       Prior to Admission medications   Medication Sig Start Date End Date Taking? Authorizing Provider  ibuprofen  (ADVIL ) 800 MG tablet Take 1 tablet (800 mg total) by mouth 3 (three) times daily as needed for up to 7 days for moderate pain (pain score 4-6). 04/01/24 04/08/24 Yes Stellan Vick, Marry RAMAN, PA-C  naproxen  (NAPROSYN ) 500 MG tablet Take 1 tablet (500 mg total) by mouth 2 (two) times daily with a meal. 06/30/22   Cleotilde Rogue, MD    Allergies: Patient has no known allergies.    Review of Systems  Musculoskeletal:  Positive for joint swelling.       Right hand swelling   All other systems reviewed and are negative.   Updated Vital Signs BP (!) 121/90 (BP Location: Left Arm)   Pulse 67   Resp 17   Ht 5' 11 (1.803 m)   Wt 108.4 kg   SpO2 95%   BMI 33.33 kg/m   Physical Exam Vitals and nursing note reviewed.  Constitutional:      Appearance: Normal appearance. He is not ill-appearing.  Eyes:     General: No scleral icterus. Pulmonary:     Effort: Pulmonary effort is normal. No respiratory distress.  Musculoskeletal:        General: Swelling and tenderness present. No deformity.     Comments: Right MP joint with swelling, no wound, abrasion or ecchymosis on exam. No obvious sign of injury. ROM intact, but weaker on right. Able to move joint above and below without limitation  Skin:    Coloration: Skin is not jaundiced.  Neurological:     General: No focal deficit present.     Mental  Status: He is alert.  Psychiatric:        Mood and Affect: Mood normal.     (all labs ordered are listed, but only abnormal results are displayed) Labs Reviewed - No data to display  EKG: None  Radiology: DG Hand Complete Right Result Date: 04/01/2024 CLINICAL DATA:  hand injury EXAM: RIGHT HAND - COMPLETE 3+ VIEW COMPARISON:  09/12/2019. FINDINGS: No acute fracture or dislocation. No aggressive osseous lesion. Mild diffuse degenerative changes of imaged joints. Redemonstration of widening of the scapholunate interval. Note is made of negative ulnar variance. No radiopaque foreign bodies. Soft tissues are within normal limits. IMPRESSION: No acute osseous abnormality of the right hand. Electronically Signed   By: Ree Molt M.D.   On: 04/01/2024 08:21      Medications Ordered in the ED  Tdap (BOOSTRIX ) injection 0.5 mL (0.5 mLs Intramuscular Patient Refused/Not Given 04/01/24 0836)                                  Medical Decision Making Amount and/or Complexity of Data Reviewed Radiology: ordered.  Risk Prescription drug management.   This patient presents to the ED  for concern of right hand swelling, this involves an extensive number of treatment options, and is a complaint that carries with it a high risk of complications and morbidity.  Differential diagnosis includes metacarpal fractures, scaphoid fracture, soft tissue contusion/edema, joint dislocation, ligament sprain.  Co morbidities: none    Imaging Studies:  I ordered imaging studies including right hand xray. I independently visualized and interpreted imaging which showed No acute osseous abnormality of the right hand.  I agree with the radiologist interpretation   Medicines ordered and prescription drug management:  I ordered medication including  Medications  Tdap (BOOSTRIX ) injection 0.5 mL (0.5 mLs Intramuscular Patient Refused/Not Given 04/01/24 0836)   Reevaluation of the patient after these  medicines showed that the patient improved I have reviewed the patients home medicines and have made adjustments as needed  Critical Interventions:  None  Consultations Obtained: None  Problem List / ED Course:     ICD-10-CM   1. Right hand pain  M79.641       MDM: x-ray negative. No evidence of fracture. Patient stable for discharge with RICE therapy.   Dispostion:  After consideration of the diagnostic results and the patients response to treatment, I feel that the patent would benefit from discharge and continued symptomatic management via RICE therapy and ibuprofen .    Final diagnoses:  Right hand pain    ED Discharge Orders          Ordered    ibuprofen  (ADVIL ) 800 MG tablet  3 times daily PRN        04/01/24 0841               Torrence Marry RAMAN, PA-C 04/01/24 9155    Cleotilde Rogue, MD 04/01/24 0900

## 2024-04-01 NOTE — ED Triage Notes (Signed)
 Pt bib pov w/ c/o rt hand pain. Pt stated he was moving some heavy metal. Metal had holes and pt had fingers in the holes. Pt stated he previously injured hand moving a refrigerator

## 2024-04-01 NOTE — ED Provider Notes (Signed)
 Pt is R hand dominant - had injury a month ago - unsure of how it happened - but yesterday he reinjured it while he was carrying something heavy.  On exam the patient has no asymmetry of his hands he has normal ability to flex and extend all of the fingers with normal sensation of both hands, normal pulses of both hands normal capillary refill of both hands, he has no reproducible tenderness when I palpate his hand and he is able to flex and extend his fingers against resistance without tenderness.  He does have an ongoing discomfort in that hand despite that.  X-rays ordered, I have looked at the x-rays and see no obvious fractures or dislocations of the bones.  RICE therapy, follow-up outpatient.  The patient has an established relationship with Dr. Margrette because of right wrist pain and states that he was told that he might need surgery but wanted to wait until he retired.  X-rays negative for acute injury, stable for discharge     Cleotilde Rogue, MD 04/01/24 0900

## 2024-04-01 NOTE — Discharge Instructions (Signed)
 It was a pleasure taking care of you today. Your x-ray was normal. No broken bones. Take ibuprofen  as necessary for pain.  Follow up with your PCP in 1 week if you still have pain.

## 2024-05-26 IMAGING — DX DG RIBS W/ CHEST 3+V*R*
3 series · 3 of 3 positions shown · non-contrast
Comparison: Chest radiograph 03/08/2020

CLINICAL DATA: Right-sided chest pain after picking up boxes last
week

EXAM:
RIGHT RIBS AND CHEST - 3+ VIEW

[chest pa]
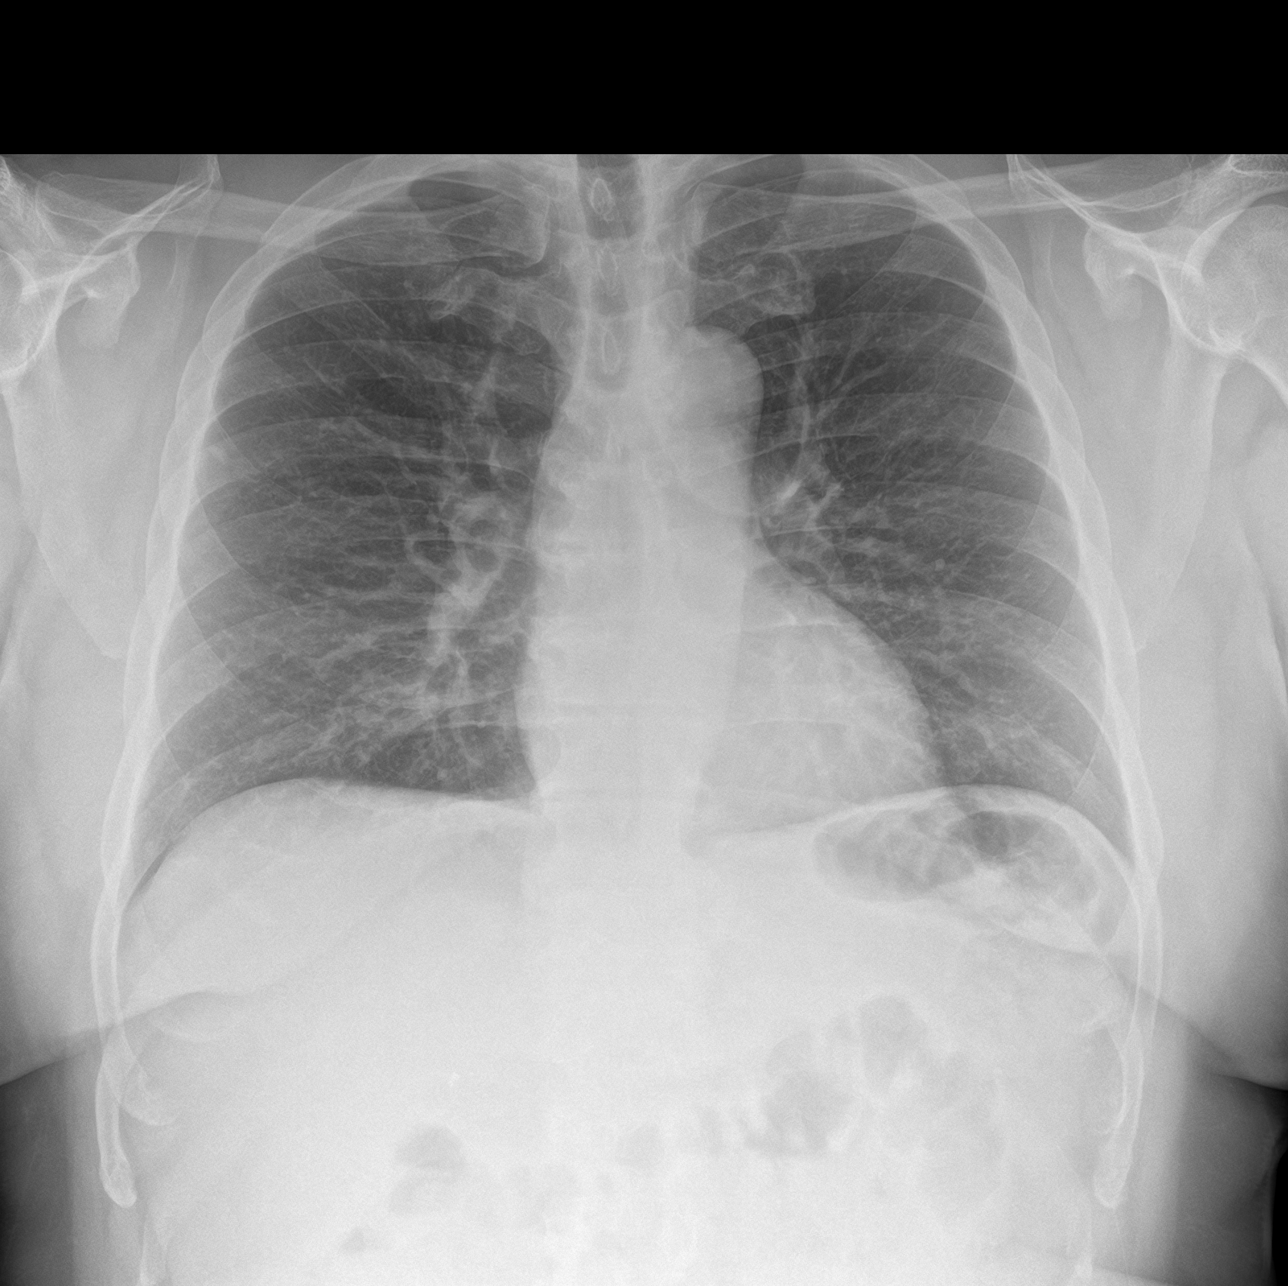

[rib pa]
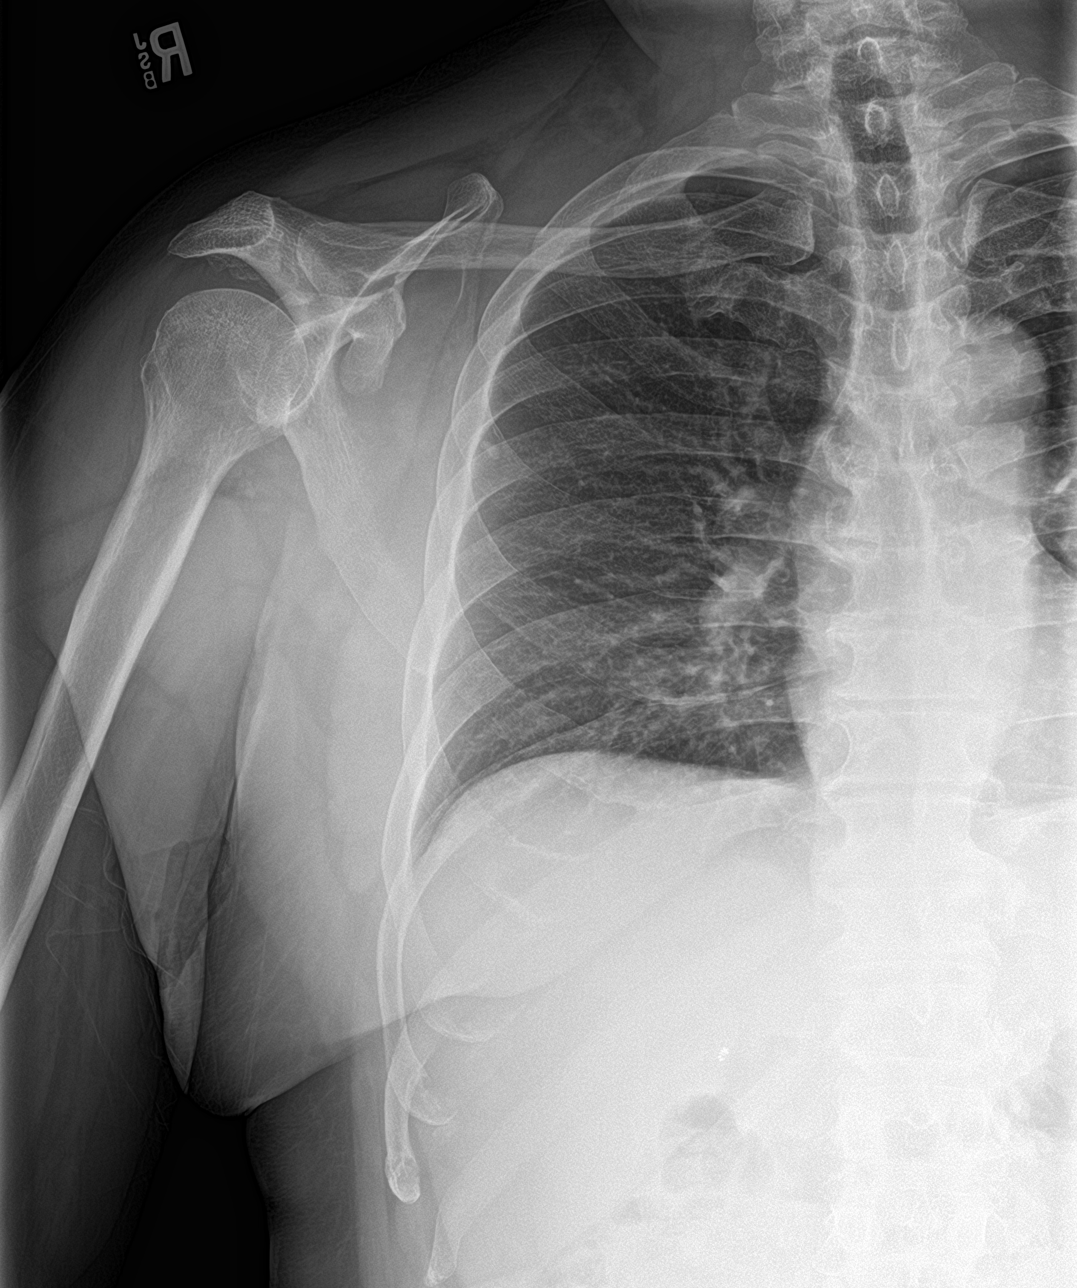

[rib pa obl]
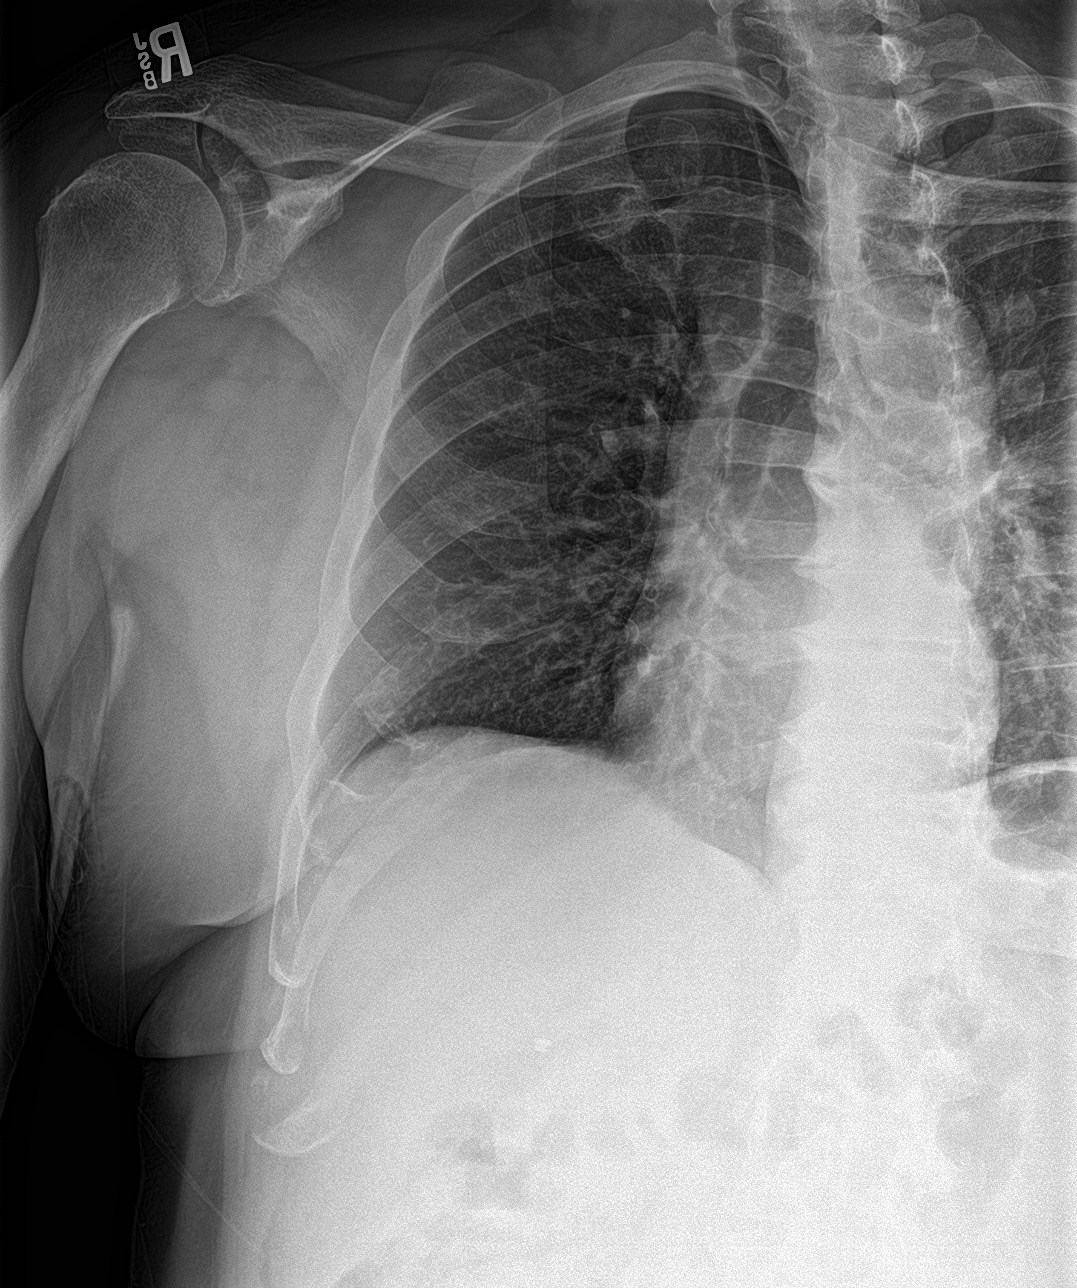

[3 of 3 positions shown; findings below may reference images not displayed]

FINDINGS: The cardiomediastinal silhouette is normal. There is no focal
consolidation or pulmonary edema. There is no pleural effusion or
pneumothorax

There is no displaced rib fracture or other acute osseous
abnormality.
IMPRESSION: No displaced rib fracture or other acute osseous abnormality
identified.
